# Patient Record
Sex: Male | Born: 1950
Health system: Southern US, Community
[De-identification: ages and names within clinical notes are randomized; demographics above are authoritative.]

## PROBLEM LIST (undated history)

## (undated) DIAGNOSIS — M549 Dorsalgia, unspecified: Secondary | ICD-10-CM

## (undated) DIAGNOSIS — Z720 Tobacco use: Secondary | ICD-10-CM

## (undated) DIAGNOSIS — Z860100 Personal history of colon polyps, unspecified: Secondary | ICD-10-CM

## (undated) DIAGNOSIS — F329 Major depressive disorder, single episode, unspecified: Secondary | ICD-10-CM

## (undated) DIAGNOSIS — J449 Chronic obstructive pulmonary disease, unspecified: Secondary | ICD-10-CM

## (undated) DIAGNOSIS — R7301 Impaired fasting glucose: Secondary | ICD-10-CM

## (undated) DIAGNOSIS — H353290 Exudative age-related macular degeneration, unspecified eye, stage unspecified: Secondary | ICD-10-CM

## (undated) DIAGNOSIS — I700 Atherosclerosis of aorta: Secondary | ICD-10-CM

## (undated) DIAGNOSIS — I2584 Coronary atherosclerosis due to calcified coronary lesion: Secondary | ICD-10-CM

## (undated) DIAGNOSIS — I491 Atrial premature depolarization: Secondary | ICD-10-CM

## (undated) DIAGNOSIS — M545 Low back pain, unspecified: Secondary | ICD-10-CM

## (undated) DIAGNOSIS — N400 Enlarged prostate without lower urinary tract symptoms: Secondary | ICD-10-CM

## (undated) DIAGNOSIS — F32A Depression, unspecified: Secondary | ICD-10-CM

## (undated) DIAGNOSIS — E785 Hyperlipidemia, unspecified: Secondary | ICD-10-CM

## (undated) DIAGNOSIS — I2510 Atherosclerotic heart disease of native coronary artery without angina pectoris: Secondary | ICD-10-CM

## (undated) HISTORY — DX: Low back pain, unspecified: M54.50

## (undated) HISTORY — DX: Tobacco use: Z72.0

## (undated) HISTORY — DX: Personal history of colonic polyps: Z86.010

## (undated) HISTORY — PX: BLEPHAROPLASTY: SUR158

## (undated) HISTORY — DX: Atherosclerosis of aorta: I70.0

## (undated) HISTORY — DX: Chronic obstructive pulmonary disease, unspecified: J44.9

## (undated) HISTORY — DX: Dorsalgia, unspecified: M54.9

## (undated) HISTORY — DX: Impaired fasting glucose: R73.01

## (undated) HISTORY — DX: Benign prostatic hyperplasia without lower urinary tract symptoms: N40.0

## (undated) HISTORY — DX: Exudative age-related macular degeneration, unspecified eye, stage unspecified: H35.3290

## (undated) HISTORY — PX: ELBOW SURGERY: SHX618

## (undated) HISTORY — DX: Atherosclerotic heart disease of native coronary artery without angina pectoris: I25.10

## (undated) HISTORY — DX: Personal history of colon polyps, unspecified: Z86.0100

## (undated) HISTORY — DX: Major depressive disorder, single episode, unspecified: F32.9

## (undated) HISTORY — PX: OTHER SURGICAL HISTORY: SHX169

## (undated) HISTORY — DX: Coronary atherosclerosis due to calcified coronary lesion: I25.84

## (undated) HISTORY — DX: Hyperlipidemia, unspecified: E78.5

## (undated) HISTORY — DX: Atrial premature depolarization: I49.1

## (undated) HISTORY — DX: Depression, unspecified: F32.A

---

## 2001-07-04 ENCOUNTER — Emergency Department (HOSPITAL_COMMUNITY): Admission: EM | Admit: 2001-07-04 | Discharge: 2001-07-04 | Payer: Self-pay | Admitting: Emergency Medicine

## 2001-07-04 ENCOUNTER — Encounter: Payer: Self-pay | Admitting: Emergency Medicine

## 2004-07-14 ENCOUNTER — Ambulatory Visit: Payer: Self-pay | Admitting: Internal Medicine

## 2004-08-23 ENCOUNTER — Ambulatory Visit: Payer: Self-pay | Admitting: Internal Medicine

## 2004-09-18 ENCOUNTER — Ambulatory Visit: Payer: Self-pay | Admitting: Internal Medicine

## 2004-10-23 ENCOUNTER — Ambulatory Visit: Payer: Self-pay | Admitting: Internal Medicine

## 2006-04-17 ENCOUNTER — Ambulatory Visit (HOSPITAL_BASED_OUTPATIENT_CLINIC_OR_DEPARTMENT_OTHER): Admission: RE | Admit: 2006-04-17 | Discharge: 2006-04-17 | Payer: Self-pay | Admitting: Orthopedic Surgery

## 2007-09-14 ENCOUNTER — Encounter: Admission: RE | Admit: 2007-09-14 | Discharge: 2007-09-14 | Payer: Self-pay | Admitting: Family Medicine

## 2007-10-08 ENCOUNTER — Ambulatory Visit (HOSPITAL_COMMUNITY): Admission: RE | Admit: 2007-10-08 | Discharge: 2007-10-09 | Payer: Self-pay | Admitting: Neurosurgery

## 2010-10-01 ENCOUNTER — Encounter: Payer: Self-pay | Admitting: Family Medicine

## 2011-01-23 NOTE — Op Note (Signed)
NAMEANIEL, HUBBLE NO.:  [id number]   MEDICAL RECORD NO.:  [id number]          PATIENT TYPE:  OBV   LOCATION:  3528                         FACILITY:  [facility]   PHYSICIAN:  Cristi Loron, M.D.DATE OF BIRTH:  Nov 14, 1950   DATE OF PROCEDURE:  10/08/2007  DATE OF DISCHARGE:  10/09/2007                               OPERATIVE REPORT   BRIEF HISTORY:  The patient is a 60 year old white male who has suffered  from neck and left arm pain consistent with a cervical radiculopathy.  He failed medical management, was worked up with a cervical MRI, which  demonstrated the patient had severe spondylosis and stenosis at C5-C6  and a herniated disk at C6-C7 on the left.  I discussed various  treatment options with the patient including surgery.  He has weighed  the risks, benefits and alternatives of surgery, so I will proceed with  the C5-C6 and C6-C7 anterior cervical diskectomy with fusion and  plating.   PREOPERATIVE DIAGNOSES:  C5-C6 spondylosis, C6-C7 herniated nucleus  pulposus, cervical myelopathy/radiculopathy, and cervicalgia.   POSTOPERATIVE DIAGNOSES:  :C5-C6 spondylosis, C6-C7  herniated nucleus  pulposus, cervical myelopathy/radiculopathy, and cervicalgia.   PROCEDURE:  C5-C6 and C6-C7 extensive anterior cervical  diskectomy/decompression; C5-C6 and C6-C7 anterior interbody arthrodesis  with autogenousbone graft extender and local morselized autograft bone;  insertion of C5-C6 and C6-C7 interbody prosthesis (Alphatec PEEK  interbody prosthesis); and anterior cervical plating C5 to C7 with  Codman Slim-Loc titanium plate and screws.   SURGEON:  Cristi Loron, M.D.   ASSISTANT:  Payton Doughty, M.D.   ANESTHESIA:  General endotracheal.   ESTIMATED BLOOD LOSS:  100 cc.   SPECIMENS:  None.   DRAINS:  None.   COMPLICATIONS:  None.   PROCEDURE:  The patient is brought to the operating room by the  anesthesia team.  General endotracheal anesthesia  was induced.  The  patient remained in supine position.  A roll was placed under his  shoulders to place his neck in slight extension.  His anterior cervical  region was then prepared with Betadine scrub and Betadine solution.  Sterile drapes were applied and then injected the area to be incised  with Marcaine with epinephrine solution.  A scalpel to make  a  transverse incision in the patient's left anterior neck.  I used  Metzenbaum scissors to divide the platysma muscle, then to dissect  medial to sternocleidomastoid muscle, jugular vein, and carotid artery.  I carefully dissected down towards the anterior cervical spine,  identifying the esophagus and retracting it medially.  We used  Kitner  swabs to clear soft tissue from the anterior cervical spine and then  inserted a bent spinal needle into upwards to  expose the intervertebral  disk space.  We obtained intraoperative radiograph to confirm our  location.   We then used electrocautery to detach the medial border of the longus  colli muscle bilaterally from C5-C6 and C6-C7 intervertebral space.  We  then inserted a Caspar self-retaining retractor underneath the longus  colli muscle bilaterally to provide exposure.  We began decompression  at  the worse level i.e., C5-C6.  We incised the C5-C6 intervertebral disk  with #15 blade scalpel, performed a partial intervertebral diskectomy  with the pituitary forceps.  We then inserted distraction screws to C5-  C6 distracted interspace and then used a high-speed drill to decorticate  the vertebral endplates at C5-C6 to drill away the remainder of C5-C6  intervertebral disk, to drill away some lateral posterior spondylosis  and to thin out the posterior longitudinal ligament.  We then incised  the ligament with arachnoid knife and removed it with the Kerrison punch  undercutting the vertebral endplates decompressing the thecal sac.  We  then performed foraminotomies about the bilateral C6  nerve root  completing decompression at this level.   We then repeated this procedure in analogous fashion in C6-C7  decompressing the C6-C7 thecal sac and bilateral C7 nerve roots.  Of  note, we encountered the expected soft disk herniation at C6-C7 and  compressed left C7 nerve root.   Having completed the decompression, we now turned attention to  arthrodesis.  We used trial spacers and determined use of 5- mm  mediumspacer at C6-C7 and a 6-mm medium spacer at C5-C6.  We prefilled  this prosthesis with a combination of local morselized autograft bone.  We obtained during the decompression as well as active diffuse bone  graft extender.  We inserted the prosthesis into distracted interspaces  at C5-C6 and C6-C7, we then removed the distraction screws.  There was a  good snug fit of the prosthesis at both levels.   We now turned attention to interspinal instrumentation.  We used a high-  speed drill to remove some ventral spondylosis from the vertebral  endplates at C5-C6 at C6-C7, so that the plate would lie down flat.  We  selected appropriate length Codman Slim-Loc anterior cervical plate and  laid it along the anterior aspect of the vertebrae from C5-C7.  We then  drilled two 40-mm Hoses at C5-C6 and C6-C7 and secured the plates tothe  vertebral bodies by placing two 40-mm self-tapping screws at C5-C6 and  C6-C7.  We obtained intraoperative radiograph.  We could only see the  upper plate screws and prosthesis because of the patient's shoulders,  but the lower plate screws and prosthesis with in vivo.  We therefore  secured the screws to the plate by locking each cam.   We then irrigated the wound with bacitracin solution, obtained  hemostasis using bipolar electrocautery.  We then removed the retractor.  We inspected the esophagus for any damage, there was none apparent.  We  then reapproximated the patient's platysma muscle with interrupted 3-0  Vicryl suture.  Subcutaneous   tissues with 3-0 Vicryl suture, and skin  with Steri-Strips and benzoin.  The wound was then coated with  bacitracin ointment.  A sterile dressing was applied.  The drapes were  removed and the patient was subsequently extubated by anesthesia team  and transported to post anesthesia care unit in stable condition.  All  sponge, instrument, and needle counts correct at this case.      Cristi Loron, M.D.  Electronically Signed     JDJ/MEDQ  D:  10/08/2007  T:  10/09/2007  Job:  [id number]

## 2011-01-26 NOTE — Op Note (Signed)
NAMEAMAURIS, [name]                ACCOUNT NO.:  [id number]   MEDICAL RECORD NO.:  [id number]          PATIENT TYPE:  AMB   LOCATION:  DSC                          FACILITY:  [facility]   PHYSICIAN:  Cindee Salt, M.D.       DATE OF BIRTH:  1951/06/02   DATE OF PROCEDURE:  04/17/2006  DATE OF DISCHARGE:                                 OPERATIVE REPORT   PREOPERATIVE DIAGNOSIS:  Loose body, right elbow.   POSTOPERATIVE DIAGNOSIS:  Loose body, right elbow.   OPERATION:  Excisoin loose body, right elbow, arthroscopically.   SURGEON:  Cindee Salt, M.D.   ASSISTANT:  Artist Pais. Mina Marble, M.D.   ANESTHESIA:  General.   HISTORY:  The patient is a 60 year old male with a history of mechanical  symptoms of his right elbow.  X-ray and MRIs revealed a loose body in the  anterior aspect of the joint.  He has no prior history of discreet injury.  He is desirous of removal.  He is aware of risks and complications including  injury to the arteries, tendons, and nerves with continuation of loose body  formation, possibility of infection, incomplete relief of symptoms,  dystrophy.   PROCEDURE:  The patient was seen in the preoperative area.  Questions  encouraged and answered, the extremity marked.  The patient was taken to the  operating room where a general anesthetic was carried without difficulty.  He was placed in lateral decubitus position with bean bag support of his  right arm.  Prepped using Duraprep in the lateral decubitus position with  the right arm free.  The joint was inflated through the soft spot after  marking each one of the landmarks.  A medial portal was the opened,  approximately 2 cm proximal to the medial epicondyle.  An incision was made  through skin only.  A hemostat was used to identify the medial intramuscular  septum.  A blunt trocar was used to enter the joint after inflation with 20  cc of saline.  The loose body was immediately apparent directly anterior to  the  radial head.  The capitellum showed no changes.  There were no changes  on the medial side of the joint.  A 22-gauge spinal needle was then used to  localize the radial portal.  This was done approximately 1.5 cm proximal to  the lateral epicondyle and directly anterior to the lateral epicondyle.  The  needle localized the loose body and incision made.  Then, a blunt trocar was  used to enter the joint.  A grasper was then introduced and the loose body  was easily retrieved.  This measured approximately 1.5 cm in diameter.  No  further loose bodies were noted in the anterior joint.  The posterior joint  was then inspected through the soft spot.  The incision made.  A hemostat  used to enter the joint.  The joint was inspected posteriorly.  There were  arthritic changes on the olecranon and on the radial head but no further  loose bodies were noted posteriorly.  An irrigation catheter was placed  in  the posterior central portion proximal to the olecranon tip.  The lateral  gutter was then fully inspected.  The posterior fossa was inspected and no  further lesions were noted.  No loose bodies were present.  The joint was  then irrigated and 40 mg Kenalog, along with 2 cc of Marcaine were then  injected through the posterior portal.  The instruments  were removed.  The portals closed with interrupted 5-0 nylon sutures and a  sterile compressive dressing applied.  The patient tolerated the procedure  well and was taken to the recovery room for observation in satisfactory  condition.  He will be discharged home to return to the [facility] of  [place] in one week on Vicodin.           ______________________________  Cindee Salt, M.D.     GK/MEDQ  D:  04/17/2006  T:  04/17/2006  Job:  [id number]

## 2011-05-31 LAB — CBC
HCT: 43.2
Hemoglobin: 15.3
MCHC: 35.3
MCV: 90.1
Platelets: 263
RBC: 4.8
RDW: 13.5
WBC: 10.3

## 2013-07-23 ENCOUNTER — Other Ambulatory Visit: Payer: Self-pay | Admitting: Family Medicine

## 2013-07-23 DIAGNOSIS — F17200 Nicotine dependence, unspecified, uncomplicated: Secondary | ICD-10-CM

## 2013-07-31 ENCOUNTER — Ambulatory Visit
Admission: RE | Admit: 2013-07-31 | Discharge: 2013-07-31 | Disposition: A | Discharge status: Home / Self Care | Payer: No Typology Code available for payment source | Source: Ambulatory Visit | Attending: Family Medicine | Admitting: Family Medicine

## 2013-07-31 DIAGNOSIS — F17200 Nicotine dependence, unspecified, uncomplicated: Secondary | ICD-10-CM

## 2013-08-20 ENCOUNTER — Encounter: Payer: Self-pay | Admitting: Cardiology

## 2013-08-20 ENCOUNTER — Encounter: Payer: Self-pay | Admitting: *Deleted

## 2013-08-20 DIAGNOSIS — I491 Atrial premature depolarization: Secondary | ICD-10-CM | POA: Insufficient documentation

## 2013-08-20 DIAGNOSIS — M549 Dorsalgia, unspecified: Secondary | ICD-10-CM | POA: Insufficient documentation

## 2013-08-20 DIAGNOSIS — E785 Hyperlipidemia, unspecified: Secondary | ICD-10-CM | POA: Insufficient documentation

## 2013-08-20 DIAGNOSIS — F329 Major depressive disorder, single episode, unspecified: Secondary | ICD-10-CM | POA: Insufficient documentation

## 2013-08-21 ENCOUNTER — Encounter (INDEPENDENT_AMBULATORY_CARE_PROVIDER_SITE_OTHER): Payer: Self-pay

## 2013-08-21 ENCOUNTER — Encounter: Payer: Self-pay | Admitting: Cardiology

## 2013-08-21 ENCOUNTER — Ambulatory Visit (INDEPENDENT_AMBULATORY_CARE_PROVIDER_SITE_OTHER): Payer: 59 | Admitting: Cardiology

## 2013-08-21 VITALS — BP 120/64 | HR 51 | Ht 66.0 in | Wt 158.0 lb

## 2013-08-21 DIAGNOSIS — F17200 Nicotine dependence, unspecified, uncomplicated: Secondary | ICD-10-CM

## 2013-08-21 DIAGNOSIS — I2584 Coronary atherosclerosis due to calcified coronary lesion: Secondary | ICD-10-CM

## 2013-08-21 DIAGNOSIS — I2510 Atherosclerotic heart disease of native coronary artery without angina pectoris: Secondary | ICD-10-CM

## 2013-08-21 DIAGNOSIS — I700 Atherosclerosis of aorta: Secondary | ICD-10-CM | POA: Insufficient documentation

## 2013-08-21 DIAGNOSIS — Z720 Tobacco use: Secondary | ICD-10-CM

## 2013-08-21 DIAGNOSIS — I491 Atrial premature depolarization: Secondary | ICD-10-CM

## 2013-08-21 HISTORY — DX: Atherosclerotic heart disease of native coronary artery without angina pectoris: I25.10

## 2013-08-21 HISTORY — DX: Atherosclerosis of aorta: I70.0

## 2013-08-21 NOTE — Progress Notes (Signed)
[address] [place], [place]  [address] Phone: [phone] Fax:  [phone]  Date:  08/21/2013   ID:  VINOD MIKESELL, DOB [birth year], MRN [id number]  PCP:  Mickie Hillier, MD   History of Present Illness: [name] is a 62 y.o. male who was previously seen last year with palpitations. PACs were seen on EKG. He used to drink approximately 4 cups of caffeine in the morning. TSH was normal at that time. His exercise treadmill test performed at that time was low risk. His LDL cholesterol in 7/09 was 188. He continues to smokes packs a day. He's had multiple statin intolerances. Pravastatin caused GI upset. He has seen a specialist at [facility].  Feeling well. Working out daily. Very vigorous workout routine. No exertional anginal symptoms.  Came today because he has had a calcium score of approximately 100, his brother is 10. Mild calcium was seen in the LAD as well as RCA as well as thoracic aorta. We discussed this at length. Please see below.  Is currently using electronic cigarettes. Struggled with quitting.    Wt Readings from Last 3 Encounters:  08/21/13 158 lb (71.668 kg)     Past Medical History  Diagnosis Date  . Depression   . Dyslipidemia   . Hyperlipidemia   . Back pain   . PAC (premature atrial contraction)     Past Surgical History  Procedure Laterality Date  . C5-c6 and c6-c7 anterior cervical discectomy      with fusion and plating in 2009 with neurosurgeon Dr. Lovell Sheehan  . Elbow surgery      Current Outpatient Prescriptions  Medication Sig Dispense Refill  . ezetimibe (ZETIA) 10 MG tablet Take 10 mg by mouth daily.      . finasteride (PROSCAR) 5 MG tablet Take 5 mg by mouth daily.       No current facility-administered medications for this visit.    Allergies:    Allergies  Allergen Reactions  . Statins     CAUSES EXTREME  MUSCLE AND JOINT PAIN    Social History:  The patient  reports that he has been  smoking.  He does not have any smokeless tobacco history on file. He reports that he drinks alcohol. He reports that he does not use illicit drugs.   ROS:  Please see the history of present illness.   Denies any syncope, bleeding, orthopnea, PND    PHYSICAL EXAM: VS:  BP 120/64  Pulse 51  Ht 5\' 6"  (1.676 m)  Wt 158 lb (71.668 kg)  BMI 25.51 kg/m2  SpO2 98% Well nourished, well developed, in no acute distress HEENT: normal Neck: no JVD Cardiac:  normal S1, S2; RRR; no murmur Lungs:  clear to auscultation bilaterally, no wheezing, rhonchi or rales Abd: soft, nontender, no hepatomegaly Ext: no edema Skin: warm and dry Neuro: no focal abnormalities noted  EKG:  Sinus bradycardia rate 51 with premature atrial contraction.    Screening CT scan-LAD/RCA/aortic atherosclerosis noted. Personally viewed with him.  ASSESSMENT AND PLAN:  1. Coronary artery calcification-calcium score was approximately 100. This was not greater than 400. Given his lack of symptoms, prior exercise treadmill test, I do not think that he requires further cardiac testing at this time however I would continue to advocate secondary prevention efforts most notably tobacco cessation. I expressed the importance of this to him. Out of all of his risk factors, smoking obviously is the greatest and will  likely contribute to any future cardiovascular events. He knows that if she begins to feel any worsening shortness of breath, chest pain to let me know and further stress testing can take place. 2. Tobacco cessation discussed at length. 3. Aortic atherosclerosis-multiple statin intolerances. Continue with Zetia. 4. Premature atrial contractions-benign. Seen on EKG. Echocardiogram in 2010 with normal EF.  Signed, Donato Schultz, MD [facility]  08/21/2013 3:30 PM

## 2013-08-21 NOTE — Patient Instructions (Signed)
Your physician recommends that you continue on your current medications as directed. Please refer to the Current Medication list given to you today.  Your physician wants you to follow-up as needed. 

## 2014-07-15 ENCOUNTER — Telehealth: Payer: Self-pay

## 2014-07-15 NOTE — Telephone Encounter (Signed)
Spoke with patient regarding post ioeraitve status, he states that he is doing well and is keeping his foot elevated and taking pain medications as scheduled, patient is to be seen in office tomorrow

## 2014-07-16 NOTE — Telephone Encounter (Signed)
error 

## 2015-02-04 ENCOUNTER — Other Ambulatory Visit: Payer: Self-pay | Admitting: Family Medicine

## 2015-02-04 DIAGNOSIS — F17200 Nicotine dependence, unspecified, uncomplicated: Secondary | ICD-10-CM

## 2015-02-15 ENCOUNTER — Ambulatory Visit
Admission: RE | Admit: 2015-02-15 | Discharge: 2015-02-15 | Disposition: A | Discharge status: Home / Self Care | Payer: No Typology Code available for payment source | Source: Ambulatory Visit | Attending: Family Medicine | Admitting: Family Medicine

## 2015-02-15 DIAGNOSIS — F17200 Nicotine dependence, unspecified, uncomplicated: Secondary | ICD-10-CM

## 2015-03-01 ENCOUNTER — Encounter: Payer: Self-pay | Admitting: Acute Care

## 2015-11-29 ENCOUNTER — Other Ambulatory Visit: Payer: Self-pay | Admitting: Acute Care

## 2015-11-29 DIAGNOSIS — F17210 Nicotine dependence, cigarettes, uncomplicated: Principal | ICD-10-CM

## 2016-02-20 ENCOUNTER — Ambulatory Visit
Admission: RE | Admit: 2016-02-20 | Discharge: 2016-02-20 | Disposition: A | Discharge status: Home / Self Care | Payer: 59 | Source: Ambulatory Visit | Attending: Acute Care | Admitting: Acute Care

## 2016-02-20 DIAGNOSIS — F17210 Nicotine dependence, cigarettes, uncomplicated: Principal | ICD-10-CM

## 2016-02-22 ENCOUNTER — Telehealth: Payer: Self-pay | Admitting: Acute Care

## 2016-02-22 DIAGNOSIS — F17210 Nicotine dependence, cigarettes, uncomplicated: Principal | ICD-10-CM

## 2016-02-22 NOTE — Telephone Encounter (Signed)
Message left that scan was normal on cell phone voice mail.Requested that patient return call if he wanted more detailed information. I will forward results to the patient's PCP for his medical record..Marland Kitchen

## 2016-11-09 DIAGNOSIS — J0100 Acute maxillary sinusitis, unspecified: Secondary | ICD-10-CM | POA: Diagnosis not present

## 2017-02-05 DIAGNOSIS — R001 Bradycardia, unspecified: Secondary | ICD-10-CM | POA: Diagnosis not present

## 2017-02-05 DIAGNOSIS — J209 Acute bronchitis, unspecified: Secondary | ICD-10-CM | POA: Diagnosis not present

## 2017-02-15 DIAGNOSIS — Z0000 Encounter for general adult medical examination without abnormal findings: Secondary | ICD-10-CM | POA: Diagnosis not present

## 2017-02-15 DIAGNOSIS — E782 Mixed hyperlipidemia: Secondary | ICD-10-CM | POA: Diagnosis not present

## 2017-02-22 ENCOUNTER — Ambulatory Visit
Admission: RE | Admit: 2017-02-22 | Discharge: 2017-02-22 | Disposition: A | Discharge status: Home / Self Care | Payer: 59 | Source: Ambulatory Visit | Attending: Acute Care | Admitting: Acute Care

## 2017-02-22 DIAGNOSIS — Z87891 Personal history of nicotine dependence: Secondary | ICD-10-CM | POA: Diagnosis not present

## 2017-02-22 DIAGNOSIS — F17210 Nicotine dependence, cigarettes, uncomplicated: Principal | ICD-10-CM

## 2017-02-25 ENCOUNTER — Other Ambulatory Visit: Payer: Self-pay | Admitting: Acute Care

## 2017-02-25 DIAGNOSIS — Z87891 Personal history of nicotine dependence: Secondary | ICD-10-CM

## 2017-02-27 ENCOUNTER — Telehealth: Payer: Self-pay

## 2017-02-27 NOTE — Telephone Encounter (Signed)
SENT NOTES TO SCHEDULING 

## 2017-05-06 ENCOUNTER — Encounter: Payer: Self-pay | Admitting: Internal Medicine

## 2017-05-06 ENCOUNTER — Ambulatory Visit (INDEPENDENT_AMBULATORY_CARE_PROVIDER_SITE_OTHER): Payer: 59 | Admitting: Internal Medicine

## 2017-05-06 VITALS — BP 130/74 | HR 48 | Ht 66.0 in | Wt 169.5 lb

## 2017-05-06 DIAGNOSIS — I2510 Atherosclerotic heart disease of native coronary artery without angina pectoris: Secondary | ICD-10-CM

## 2017-05-06 DIAGNOSIS — E782 Mixed hyperlipidemia: Secondary | ICD-10-CM

## 2017-05-06 NOTE — Patient Instructions (Signed)
Your physician recommends that you continue on your current medications as directed. Please refer to the Current Medication list given to you today. SCHEDULE A CALCIUM SCORE CT SCAN AT CHECKOUT TODAY.  THE OUT OF POCKET COST IS $150 FOR THIS EXAM.  Your physician recommends that you schedule a follow-up appointment WITH OUR PHARMACIST TO DISCUSS STARTING REPATHA.

## 2017-05-06 NOTE — Progress Notes (Signed)
Cardiology Office Note   Date:  05/06/2017   ID:  [name] 05-May-1951, MRN [id number]  PCP:  Catha Gosselin, MD  Cardiologist:   Dietrich Pates, MD   Pt referredd by Dr Clarene Duke for lipid eval and CAD eval    History of Present Illness: [name] is a 66 y.o. male with a history of HL, coronary calcifications, tob use    Intoleranct to statins    Previously followed by Michele Rockers  Seen in Dec 2014 for palpitations , PACs    LDL in past 188   Pt says Zetia didn't help much  Denies CP  Active  Runs [employer]  Works out regularly No SOB   Pt quit tobacco about 3 months ago  Breathing is much better    Current Meds  Medication Sig  . finasteride (PROSCAR) 5 MG tablet Take 5 mg by mouth daily.  . tamsulosin (FLOMAX) 0.4 MG CAPS capsule Take 0.4 mg by mouth.     Allergies:   Statins   Past Medical History:  Diagnosis Date  . Aortic atherosclerosis (HCC) 08/21/2013   Seen on CT scan  . Back pain   . Coronary artery calcification 08/21/2013   LAD/RCA-calcium score 160 in 2014  . Depression   . Dyslipidemia   . Hyperlipidemia   . PAC (premature atrial contraction)     Past Surgical History:  Procedure Laterality Date  . C5-C6 and C6-C7 anterior cervical discectomy     with fusion and plating in 2009 with neurosurgeon Dr. Lovell Sheehan  . ELBOW SURGERY       Social History:  The patient  reports that he has quit smoking. His smoking use included Cigarettes. He has a 32.00 pack-year smoking history. He does not have any smokeless tobacco history on file. He reports that he drinks alcohol. He reports that he does not use drugs.   Family History:  The patient's family history includes Heart attack in his father.    ROS:  Please see the history of present illness. All other systems are reviewed and  Negative to the above problem except as noted.    PHYSICAL EXAM: VS:  BP 130/74   Pulse (!) 48   Ht 5\' 6"  (1.676 m)   Wt 169 lb 8 oz (76.9 kg)   SpO2  97%   BMI 27.36 kg/m   GEN: Well nourished, well developed, in no acute distress  HEENT: normal  Neck: no JVD, carotid bruits, or masses Cardiac: RRR; no murmurs, rubs, or gallops,no edema  Respiratory:  clear to auscultation bilaterally, normal work of breathing GI: soft, nontender, nondistended, + BS  No hepatomegaly  MS: no deformity Moving all extremities   Skin: warm and dry, no rash Neuro:  Strength and sensation are intact Psych: euthymic mood, full affect   EKG:  EKG is ordered today.  SB 48 bpm  Septal MI     Lipid Panel No results found for: CHOL, TRIG, HDL, CHOLHDL, VLDL, LDLCALC, LDLDIRECT    Wt Readings from Last 3 Encounters:  05/06/17 169 lb 8 oz (76.9 kg)  08/21/13 158 lb (71.7 kg)      ASSESSMENT AND PLAN:  1  CAD  CA score as 162 4 years ago  He is interested in seeing if progressed  Will set up for CT score  2  HL  He needs tighter control of lipidsLDL was 132 in June HDL was 23   Did not tolerate statins  Multiple due to myalgias  Zetia didn't do much   Recomm Repatha  F/U lipids in 2 months    Tentative f/u in clinic in 1 year      Current medicines are reviewed at length with the patient today.  The patient does not have concerns regarding medicines.  Signed, Dietrich Pates, MD  05/06/2017 10:21 AM    [facility] [address] [place], [place], [place]  [address] Phone: [phone]; Fax: [phone]

## 2017-05-17 ENCOUNTER — Ambulatory Visit (INDEPENDENT_AMBULATORY_CARE_PROVIDER_SITE_OTHER)
Admission: RE | Admit: 2017-05-17 | Discharge: 2017-05-17 | Disposition: A | Discharge status: Home / Self Care | Payer: Self-pay | Source: Ambulatory Visit | Attending: Internal Medicine | Admitting: Internal Medicine

## 2017-05-17 DIAGNOSIS — I2510 Atherosclerotic heart disease of native coronary artery without angina pectoris: Secondary | ICD-10-CM

## 2017-05-22 ENCOUNTER — Encounter: Payer: Self-pay | Admitting: Internal Medicine

## 2017-05-24 DIAGNOSIS — H33193 Other retinoschisis and retinal cysts, bilateral: Secondary | ICD-10-CM | POA: Diagnosis not present

## 2017-05-24 DIAGNOSIS — H35721 Serous detachment of retinal pigment epithelium, right eye: Secondary | ICD-10-CM | POA: Diagnosis not present

## 2017-05-30 NOTE — Progress Notes (Signed)
Patient ID: [name]                 DOB: Dec 06, 1950                    MRN: [id number]     HPI: [name] is a 66 y.o. male patient referred to lipid clinic by Dr Tenny Craw. PMH is significant for HLD, aortic atherosclerosis seen on CT scan, tobacco use, and statin intolerance. Pt had an elevated calcium score of 160 in 2014. This was repeated in 05/2017 and revealed a calcium score of 138 which was 66th percentile when matched for age and sex. Moderate calcium seen in the proximal and mid LAD and RCA. Pt referred to lipid clinic to discuss PCSK9i therapy.  Pt present today in good spirits. He reports trying multiple statins over the years but has not tried any within the last 7-8 years. He reports crippling leg pain that affected his ability to walk up the stairs. His symptoms would start within 1 week of statin initiation and stopped upon drug discontinuation. He recalls trying Lipitor, Crestor, and pravastatin. He cannot recall doses for any of these. He also tried Zetia however reports that this was ineffective at lowering his cholesterol.  Current Medications: none Intolerances: pravastatin - GI upset, Lipitor, Crestor, Zetia - ineffective Risk Factors: aortic atherosclerosis, elevated calcium score, smoking, family history LDL goal: 70mg /dL  Diet: Works out of town and frequently eats at [employer]. Does eat a lot of lean meat ([place] and chicken). Does not eat much dairy. Does eat 2-3 eggs per day.   Exercise: Stays active - goes to the gym 5 days a week. Alternates cardio and weights.   Family History: The patient's family history includes Heart attack in his father.   Social History: The patient  reports that he has quit smoking. His smoking use included Cigarettes. He has a 32.00 pack-year smoking history. He does not have any smokeless tobacco history on file. He reports that he drinks alcohol. He reports that he does not use drugs.   Labs: 02/15/17: TC 213, TG 121, HDL 56,  LDL 132 (no therapy) 02/09/16: TC 204, TG 152, HDL 48, LDL 125 02/02/15: TC 199, TG 96, HDL 48, LDL 132  Past Medical History:  Diagnosis Date  . Aortic atherosclerosis (HCC) 08/21/2013   Seen on CT scan  . Back pain   . Coronary artery calcification 08/21/2013   LAD/RCA-calcium score 160 in 2014  . Depression   . Dyslipidemia   . Hyperlipidemia   . PAC (premature atrial contraction)     Current Outpatient Prescriptions on File Prior to Visit  Medication Sig Dispense Refill  . finasteride (PROSCAR) 5 MG tablet Take 5 mg by mouth daily.    . tamsulosin (FLOMAX) 0.4 MG CAPS capsule Take 0.4 mg by mouth.     No current facility-administered medications on file prior to visit.     Allergies  Allergen Reactions  . Statins     CAUSES EXTREME  MUSCLE AND JOINT PAIN    Assessment/Plan:  1. Hyperlipidemia - LDL 132 at baseline above goal <70 due to history of aortic atherosclerosis and elevated calcium score. Pt is intolerant to multiple statins and Zetia was ineffective at lowering his cholesterol. Discussed PCSK9i therapy, however pt does not want to take bimonthly injections since he is frequently on the road for work. I also checked with his [facility] insurance and they do not currently cover PCSK9i therapy for  patients with CAD without revascularization. Pt would prefer to rechallenge with statin therapy. Will start Crestor  daily. Advised pt that if he experiences myalgias he can reduce frequency to 3x per week. Will call pt in 1 month to assess tolerability to Crestor and can adjust statin therapy at that time if needed. Can try Livalo in the future if pt does not end up tolerating Crestor.   Megan E. Supple, PharmD, CPP, BCACP [facility] [address] [place], [place] [address] Phone: [phone]; Fax: [phone] 05/31/2017 9:19 AM

## 2017-05-31 ENCOUNTER — Ambulatory Visit (INDEPENDENT_AMBULATORY_CARE_PROVIDER_SITE_OTHER): Payer: 59 | Admitting: Pharmacist

## 2017-05-31 DIAGNOSIS — E785 Hyperlipidemia, unspecified: Secondary | ICD-10-CM | POA: Diagnosis not present

## 2017-05-31 MED ORDER — ROSUVASTATIN CALCIUM 5 MG PO TABS: 5.0000 mg | ORAL_TABLET | Freq: Every day | ORAL | 11 refills | Status: DC

## 2017-05-31 NOTE — Patient Instructions (Signed)
It was nice to see you today!  Start taking rosuvastatin (Crestor)  daily for your cholesterol. If you develop muscle aches, try cutting your dose back to 3x per week.  Try to cut back on your egg yolk intake by 50%. Try to supplement with egg whites instead.  Call Megan in the lipid clinic in about a month and let me know how you're doing with the Crestor [phone].

## 2017-06-17 DIAGNOSIS — H353122 Nonexudative age-related macular degeneration, left eye, intermediate dry stage: Secondary | ICD-10-CM | POA: Diagnosis not present

## 2017-06-17 DIAGNOSIS — H353211 Exudative age-related macular degeneration, right eye, with active choroidal neovascularization: Secondary | ICD-10-CM | POA: Diagnosis not present

## 2017-06-17 DIAGNOSIS — H35423 Microcystoid degeneration of retina, bilateral: Secondary | ICD-10-CM | POA: Diagnosis not present

## 2017-06-26 ENCOUNTER — Telehealth: Payer: Self-pay

## 2017-06-26 NOTE — Telephone Encounter (Signed)
Received message from refill dept: pharmacy was asking for possible cheaper alternative.  Reviewed with [facility].   Pt paying about $30 per month.   The pharmacy wanted to offer that maybe he could try something lower cost.   She said the patient said he would pay if that's what we want him on.    I explained its noted he is intolerant to several statins and if he's tolerating and can afford we should not consider changing at this point

## 2017-07-01 ENCOUNTER — Telehealth: Payer: Self-pay | Admitting: Pharmacist

## 2017-07-01 DIAGNOSIS — H353211 Exudative age-related macular degeneration, right eye, with active choroidal neovascularization: Secondary | ICD-10-CM | POA: Diagnosis not present

## 2017-07-01 DIAGNOSIS — E782 Mixed hyperlipidemia: Secondary | ICD-10-CM

## 2017-07-01 NOTE — Telephone Encounter (Signed)
Called pt to assess Crestor tolerability during the past month. Pt is very pleased that he has been tolerating Crestor 5mg  daily with no adverse effects. He is agreeable to continuing therapy. Scheduled f/u lab work in another 6 weeks to assess efficacy.

## 2017-07-29 DIAGNOSIS — H353122 Nonexudative age-related macular degeneration, left eye, intermediate dry stage: Secondary | ICD-10-CM | POA: Diagnosis not present

## 2017-07-29 DIAGNOSIS — H43813 Vitreous degeneration, bilateral: Secondary | ICD-10-CM | POA: Diagnosis not present

## 2017-07-29 DIAGNOSIS — H35423 Microcystoid degeneration of retina, bilateral: Secondary | ICD-10-CM | POA: Diagnosis not present

## 2017-08-08 ENCOUNTER — Encounter: Payer: Self-pay | Admitting: Internal Medicine

## 2017-08-12 ENCOUNTER — Other Ambulatory Visit: Payer: 59 | Admitting: *Deleted

## 2017-08-12 DIAGNOSIS — E782 Mixed hyperlipidemia: Secondary | ICD-10-CM

## 2017-08-12 LAB — LIPID PANEL
Chol/HDL Ratio: 2.7 ratio (ref 0.0–5.0)
Cholesterol, Total: 186 mg/dL (ref 100–199)
HDL: 69 mg/dL (ref >39)
LDL Calculated: 108 mg/dL — ABNORMAL HIGH (ref 0–99)
TRIGLYCERIDES: 43 mg/dL (ref 0–149)
VLDL Cholesterol Cal: 9 mg/dL (ref 5–40)

## 2017-08-12 LAB — HEPATIC FUNCTION PANEL
ALT: 12 IU/L (ref 0–44)
AST: 19 IU/L (ref 0–40)
Albumin: 4.4 g/dL (ref 3.6–4.8)
Alkaline Phosphatase: 51 IU/L (ref 39–117)
Bilirubin Total: 0.3 mg/dL (ref 0.0–1.2)
Bilirubin, Direct: 0.09 mg/dL (ref 0.00–0.40)
TOTAL PROTEIN: 6.1 g/dL (ref 6.0–8.5)

## 2017-08-13 ENCOUNTER — Telehealth: Payer: Self-pay | Admitting: Pharmacist

## 2017-08-13 DIAGNOSIS — E782 Mixed hyperlipidemia: Secondary | ICD-10-CM

## 2017-08-13 MED ORDER — ROSUVASTATIN CALCIUM 10 MG PO TABS: 10.0000 mg | ORAL_TABLET | Freq: Every day | ORAL | 11 refills | Status: DC

## 2017-08-13 NOTE — Telephone Encounter (Signed)
Pt has been tolerating Crestor 5mg  daily and LDL has improved from baseline of 132 to 108, however remains above aggressive goal < 70 due to hx of elevated calcium score and aortic atherosclerosis seen on CT scan. Pt is willing to increase Crestor to 10mg  daily. Will recheck lipids in 3 months.

## 2017-08-26 DIAGNOSIS — H353122 Nonexudative age-related macular degeneration, left eye, intermediate dry stage: Secondary | ICD-10-CM | POA: Diagnosis not present

## 2017-08-26 DIAGNOSIS — Z23 Encounter for immunization: Secondary | ICD-10-CM | POA: Diagnosis not present

## 2017-08-26 DIAGNOSIS — H35423 Microcystoid degeneration of retina, bilateral: Secondary | ICD-10-CM | POA: Diagnosis not present

## 2017-08-26 DIAGNOSIS — H43813 Vitreous degeneration, bilateral: Secondary | ICD-10-CM | POA: Diagnosis not present

## 2017-08-31 DIAGNOSIS — H66002 Acute suppurative otitis media without spontaneous rupture of ear drum, left ear: Secondary | ICD-10-CM | POA: Diagnosis not present

## 2017-09-02 DIAGNOSIS — H9202 Otalgia, left ear: Secondary | ICD-10-CM | POA: Diagnosis not present

## 2017-09-02 DIAGNOSIS — H6092 Unspecified otitis externa, left ear: Secondary | ICD-10-CM | POA: Diagnosis not present

## 2017-09-02 DIAGNOSIS — H6692 Otitis media, unspecified, left ear: Secondary | ICD-10-CM | POA: Diagnosis not present

## 2017-09-06 DIAGNOSIS — R21 Rash and other nonspecific skin eruption: Secondary | ICD-10-CM | POA: Diagnosis not present

## 2017-09-06 DIAGNOSIS — H6022 Malignant otitis externa, left ear: Secondary | ICD-10-CM | POA: Diagnosis not present

## 2017-09-06 DIAGNOSIS — H9202 Otalgia, left ear: Secondary | ICD-10-CM | POA: Diagnosis not present

## 2017-09-13 DIAGNOSIS — H60502 Unspecified acute noninfective otitis externa, left ear: Secondary | ICD-10-CM | POA: Diagnosis not present

## 2017-09-13 DIAGNOSIS — H6122 Impacted cerumen, left ear: Secondary | ICD-10-CM | POA: Diagnosis not present

## 2017-09-27 DIAGNOSIS — H353211 Exudative age-related macular degeneration, right eye, with active choroidal neovascularization: Secondary | ICD-10-CM | POA: Diagnosis not present

## 2017-09-27 DIAGNOSIS — H353122 Nonexudative age-related macular degeneration, left eye, intermediate dry stage: Secondary | ICD-10-CM | POA: Diagnosis not present

## 2017-10-04 DIAGNOSIS — J310 Chronic rhinitis: Secondary | ICD-10-CM | POA: Diagnosis not present

## 2017-10-04 DIAGNOSIS — H6022 Malignant otitis externa, left ear: Secondary | ICD-10-CM | POA: Diagnosis not present

## 2017-10-04 DIAGNOSIS — J342 Deviated nasal septum: Secondary | ICD-10-CM | POA: Diagnosis not present

## 2017-11-11 DIAGNOSIS — H43813 Vitreous degeneration, bilateral: Secondary | ICD-10-CM | POA: Diagnosis not present

## 2017-11-11 DIAGNOSIS — H353122 Nonexudative age-related macular degeneration, left eye, intermediate dry stage: Secondary | ICD-10-CM | POA: Diagnosis not present

## 2017-11-11 DIAGNOSIS — H35423 Microcystoid degeneration of retina, bilateral: Secondary | ICD-10-CM | POA: Diagnosis not present

## 2017-11-15 ENCOUNTER — Other Ambulatory Visit: Payer: 59

## 2017-11-22 ENCOUNTER — Other Ambulatory Visit: Payer: 59

## 2017-11-22 DIAGNOSIS — E782 Mixed hyperlipidemia: Secondary | ICD-10-CM | POA: Diagnosis not present

## 2017-11-22 LAB — LIPID PANEL
CHOL/HDL RATIO: 2.3 ratio (ref 0.0–5.0)
Cholesterol, Total: 163 mg/dL (ref 100–199)
HDL: 71 mg/dL (ref >39)
LDL CALC: 75 mg/dL (ref 0–99)
TRIGLYCERIDES: 87 mg/dL (ref 0–149)
VLDL Cholesterol Cal: 17 mg/dL (ref 5–40)

## 2017-11-26 ENCOUNTER — Other Ambulatory Visit: Payer: Self-pay | Admitting: *Deleted

## 2017-11-26 DIAGNOSIS — E785 Hyperlipidemia, unspecified: Secondary | ICD-10-CM

## 2017-11-26 MED ORDER — ROSUVASTATIN CALCIUM 20 MG PO TABS: 20.0000 mg | ORAL_TABLET | Freq: Every day | ORAL | 11 refills | Status: DC

## 2017-12-09 DIAGNOSIS — H35423 Microcystoid degeneration of retina, bilateral: Secondary | ICD-10-CM | POA: Diagnosis not present

## 2017-12-09 DIAGNOSIS — H353122 Nonexudative age-related macular degeneration, left eye, intermediate dry stage: Secondary | ICD-10-CM | POA: Diagnosis not present

## 2017-12-09 DIAGNOSIS — H33192 Other retinoschisis and retinal cysts, left eye: Secondary | ICD-10-CM | POA: Diagnosis not present

## 2017-12-09 DIAGNOSIS — H353211 Exudative age-related macular degeneration, right eye, with active choroidal neovascularization: Secondary | ICD-10-CM | POA: Diagnosis not present

## 2018-01-02 ENCOUNTER — Encounter: Payer: Self-pay | Admitting: Internal Medicine

## 2018-01-06 DIAGNOSIS — H35423 Microcystoid degeneration of retina, bilateral: Secondary | ICD-10-CM | POA: Diagnosis not present

## 2018-01-06 DIAGNOSIS — H353122 Nonexudative age-related macular degeneration, left eye, intermediate dry stage: Secondary | ICD-10-CM | POA: Diagnosis not present

## 2018-01-06 DIAGNOSIS — H33192 Other retinoschisis and retinal cysts, left eye: Secondary | ICD-10-CM | POA: Diagnosis not present

## 2018-01-07 ENCOUNTER — Telehealth: Payer: Self-pay | Admitting: Internal Medicine

## 2018-01-07 MED ORDER — ROSUVASTATIN CALCIUM 10 MG PO TABS: 10.0000 mg | ORAL_TABLET | Freq: Every day | ORAL | 3 refills | Status: DC

## 2018-01-07 NOTE — Addendum Note (Signed)
Addended by: Sigurd Sos on: 01/07/2018 02:09 PM   Modules accepted: Orders

## 2018-01-07 NOTE — Telephone Encounter (Signed)
Spoke with patient and gave him recommendation (resume Crestor  and re-evaluate muscles and joints). He verbalized understanding.

## 2018-01-07 NOTE — Telephone Encounter (Signed)
Pt has emailed in [employer]  Reviewed with pharmacy Would recomm he go back on Crestor 10 Follow joints/muscles

## 2018-01-07 NOTE — Telephone Encounter (Signed)
LPMTCB with Dr Tenny Craw' suggestion.  4/30

## 2018-02-10 DIAGNOSIS — H353122 Nonexudative age-related macular degeneration, left eye, intermediate dry stage: Secondary | ICD-10-CM | POA: Diagnosis not present

## 2018-02-10 DIAGNOSIS — H33102 Unspecified retinoschisis, left eye: Secondary | ICD-10-CM | POA: Diagnosis not present

## 2018-02-10 DIAGNOSIS — H43813 Vitreous degeneration, bilateral: Secondary | ICD-10-CM | POA: Diagnosis not present

## 2018-02-10 DIAGNOSIS — H353211 Exudative age-related macular degeneration, right eye, with active choroidal neovascularization: Secondary | ICD-10-CM | POA: Diagnosis not present

## 2018-02-21 ENCOUNTER — Other Ambulatory Visit: Payer: 59

## 2018-02-24 DIAGNOSIS — Z0000 Encounter for general adult medical examination without abnormal findings: Secondary | ICD-10-CM | POA: Diagnosis not present

## 2018-02-26 DIAGNOSIS — Z0000 Encounter for general adult medical examination without abnormal findings: Secondary | ICD-10-CM | POA: Diagnosis not present

## 2018-02-26 DIAGNOSIS — Z86010 Personal history of colonic polyps: Secondary | ICD-10-CM | POA: Diagnosis not present

## 2018-02-26 DIAGNOSIS — I2510 Atherosclerotic heart disease of native coronary artery without angina pectoris: Secondary | ICD-10-CM | POA: Diagnosis not present

## 2018-03-03 ENCOUNTER — Other Ambulatory Visit: Payer: 59 | Admitting: *Deleted

## 2018-03-03 DIAGNOSIS — E785 Hyperlipidemia, unspecified: Secondary | ICD-10-CM

## 2018-03-03 LAB — LIPID PANEL
CHOLESTEROL TOTAL: 173 mg/dL (ref 100–199)
Chol/HDL Ratio: 2.7 ratio (ref 0.0–5.0)
HDL: 64 mg/dL (ref >39)
LDL CALC: 95 mg/dL (ref 0–99)
Triglycerides: 68 mg/dL (ref 0–149)
VLDL Cholesterol Cal: 14 mg/dL (ref 5–40)

## 2018-03-05 ENCOUNTER — Other Ambulatory Visit: Payer: Self-pay | Admitting: *Deleted

## 2018-03-05 DIAGNOSIS — E785 Hyperlipidemia, unspecified: Secondary | ICD-10-CM

## 2018-03-10 DIAGNOSIS — H353211 Exudative age-related macular degeneration, right eye, with active choroidal neovascularization: Secondary | ICD-10-CM | POA: Diagnosis not present

## 2018-03-31 ENCOUNTER — Ambulatory Visit
Admission: RE | Admit: 2018-03-31 | Discharge: 2018-03-31 | Disposition: A | Discharge status: Home / Self Care | Payer: 59 | Source: Ambulatory Visit | Attending: Acute Care | Admitting: Acute Care

## 2018-03-31 DIAGNOSIS — Z87891 Personal history of nicotine dependence: Secondary | ICD-10-CM | POA: Diagnosis not present

## 2018-04-07 DIAGNOSIS — H43813 Vitreous degeneration, bilateral: Secondary | ICD-10-CM | POA: Diagnosis not present

## 2018-04-07 DIAGNOSIS — H33192 Other retinoschisis and retinal cysts, left eye: Secondary | ICD-10-CM | POA: Diagnosis not present

## 2018-04-07 DIAGNOSIS — H35423 Microcystoid degeneration of retina, bilateral: Secondary | ICD-10-CM | POA: Diagnosis not present

## 2018-04-08 ENCOUNTER — Other Ambulatory Visit: Payer: Self-pay | Admitting: Acute Care

## 2018-04-08 DIAGNOSIS — Z87891 Personal history of nicotine dependence: Secondary | ICD-10-CM

## 2018-04-08 DIAGNOSIS — Z122 Encounter for screening for malignant neoplasm of respiratory organs: Secondary | ICD-10-CM

## 2018-04-11 DIAGNOSIS — H353221 Exudative age-related macular degeneration, left eye, with active choroidal neovascularization: Secondary | ICD-10-CM | POA: Diagnosis not present

## 2018-05-05 DIAGNOSIS — H353231 Exudative age-related macular degeneration, bilateral, with active choroidal neovascularization: Secondary | ICD-10-CM | POA: Diagnosis not present

## 2018-05-05 DIAGNOSIS — H353211 Exudative age-related macular degeneration, right eye, with active choroidal neovascularization: Secondary | ICD-10-CM | POA: Diagnosis not present

## 2018-05-15 DIAGNOSIS — H353221 Exudative age-related macular degeneration, left eye, with active choroidal neovascularization: Secondary | ICD-10-CM | POA: Diagnosis not present

## 2018-06-09 ENCOUNTER — Other Ambulatory Visit: Payer: 59 | Admitting: *Deleted

## 2018-06-09 DIAGNOSIS — H35423 Microcystoid degeneration of retina, bilateral: Secondary | ICD-10-CM | POA: Diagnosis not present

## 2018-06-09 DIAGNOSIS — H353231 Exudative age-related macular degeneration, bilateral, with active choroidal neovascularization: Secondary | ICD-10-CM | POA: Diagnosis not present

## 2018-06-09 DIAGNOSIS — H353211 Exudative age-related macular degeneration, right eye, with active choroidal neovascularization: Secondary | ICD-10-CM | POA: Diagnosis not present

## 2018-06-09 DIAGNOSIS — H33192 Other retinoschisis and retinal cysts, left eye: Secondary | ICD-10-CM | POA: Diagnosis not present

## 2018-06-09 DIAGNOSIS — E785 Hyperlipidemia, unspecified: Secondary | ICD-10-CM | POA: Diagnosis not present

## 2018-06-09 DIAGNOSIS — H43813 Vitreous degeneration, bilateral: Secondary | ICD-10-CM | POA: Diagnosis not present

## 2018-06-09 LAB — LIPID PANEL
CHOLESTEROL TOTAL: 174 mg/dL (ref 100–199)
Chol/HDL Ratio: 2.5 ratio (ref 0.0–5.0)
HDL: 69 mg/dL (ref >39)
LDL CALC: 92 mg/dL (ref 0–99)
TRIGLYCERIDES: 63 mg/dL (ref 0–149)
VLDL Cholesterol Cal: 13 mg/dL (ref 5–40)

## 2018-06-12 ENCOUNTER — Telehealth: Payer: Self-pay | Admitting: Internal Medicine

## 2018-06-12 DIAGNOSIS — E785 Hyperlipidemia, unspecified: Secondary | ICD-10-CM

## 2018-06-12 MED ORDER — EZETIMIBE 10 MG PO TABS: 5.0000 mg | ORAL_TABLET | Freq: Every day | ORAL | 3 refills | Status: DC

## 2018-06-12 NOTE — Telephone Encounter (Signed)
Reviewed with patient. Will try Zetia 5 mg daily and continue to take Crestor 10 mg daily Lab appointment for 10/13/18 scheduled.

## 2018-06-12 NOTE — Telephone Encounter (Signed)
    Patient calling for lab results 

## 2018-06-16 DIAGNOSIS — H33193 Other retinoschisis and retinal cysts, bilateral: Secondary | ICD-10-CM | POA: Diagnosis not present

## 2018-06-16 DIAGNOSIS — H353221 Exudative age-related macular degeneration, left eye, with active choroidal neovascularization: Secondary | ICD-10-CM | POA: Diagnosis not present

## 2018-06-16 DIAGNOSIS — H35361 Drusen (degenerative) of macula, right eye: Secondary | ICD-10-CM | POA: Diagnosis not present

## 2018-07-14 DIAGNOSIS — H33192 Other retinoschisis and retinal cysts, left eye: Secondary | ICD-10-CM | POA: Diagnosis not present

## 2018-07-14 DIAGNOSIS — H43813 Vitreous degeneration, bilateral: Secondary | ICD-10-CM | POA: Diagnosis not present

## 2018-07-14 DIAGNOSIS — H35423 Microcystoid degeneration of retina, bilateral: Secondary | ICD-10-CM | POA: Diagnosis not present

## 2018-07-28 DIAGNOSIS — H353221 Exudative age-related macular degeneration, left eye, with active choroidal neovascularization: Secondary | ICD-10-CM | POA: Diagnosis not present

## 2018-07-28 DIAGNOSIS — H353231 Exudative age-related macular degeneration, bilateral, with active choroidal neovascularization: Secondary | ICD-10-CM | POA: Diagnosis not present

## 2018-08-18 DIAGNOSIS — H33192 Other retinoschisis and retinal cysts, left eye: Secondary | ICD-10-CM | POA: Diagnosis not present

## 2018-08-18 DIAGNOSIS — H43813 Vitreous degeneration, bilateral: Secondary | ICD-10-CM | POA: Diagnosis not present

## 2018-08-18 DIAGNOSIS — H35423 Microcystoid degeneration of retina, bilateral: Secondary | ICD-10-CM | POA: Diagnosis not present

## 2018-09-11 DIAGNOSIS — H353221 Exudative age-related macular degeneration, left eye, with active choroidal neovascularization: Secondary | ICD-10-CM | POA: Diagnosis not present

## 2018-09-14 DIAGNOSIS — Z23 Encounter for immunization: Secondary | ICD-10-CM | POA: Diagnosis not present

## 2018-09-22 DIAGNOSIS — H353231 Exudative age-related macular degeneration, bilateral, with active choroidal neovascularization: Secondary | ICD-10-CM | POA: Diagnosis not present

## 2018-09-22 DIAGNOSIS — H33192 Other retinoschisis and retinal cysts, left eye: Secondary | ICD-10-CM | POA: Diagnosis not present

## 2018-09-22 DIAGNOSIS — H35423 Microcystoid degeneration of retina, bilateral: Secondary | ICD-10-CM | POA: Diagnosis not present

## 2018-09-22 DIAGNOSIS — H353211 Exudative age-related macular degeneration, right eye, with active choroidal neovascularization: Secondary | ICD-10-CM | POA: Diagnosis not present

## 2018-09-22 DIAGNOSIS — H43813 Vitreous degeneration, bilateral: Secondary | ICD-10-CM | POA: Diagnosis not present

## 2018-10-13 ENCOUNTER — Other Ambulatory Visit: Payer: 59

## 2019-04-24 IMAGING — CT CT HEART SCORING
2 series · 15 of 20 positions shown, 17 images · non-contrast
Comparison: 02/22/2017

CLINICAL DATA: Risk stratification

EXAM:
Coronary Calcium Score
TECHNIQUE: The patient was scanned on a Siemens Somatom 64 slice scanner. Axial
non-contrast 3 mm slices were carried out through the heart. The
data set was analyzed on a dedicated work station and scored using
the Agatson method.

[Series 3: casc 3.0 i36f 2 bestdiast 73 % · axial · 0.35mm/px · z∈[+1186,+1288]mm · 7 of 46 slices shown, 9 images]
[im 6/46  vessel]
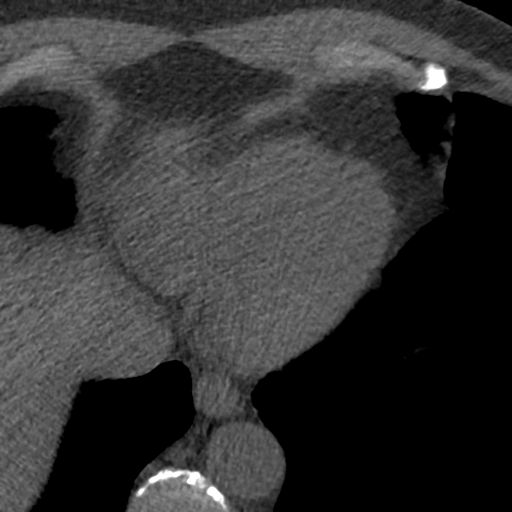
[im 6/46  lung]
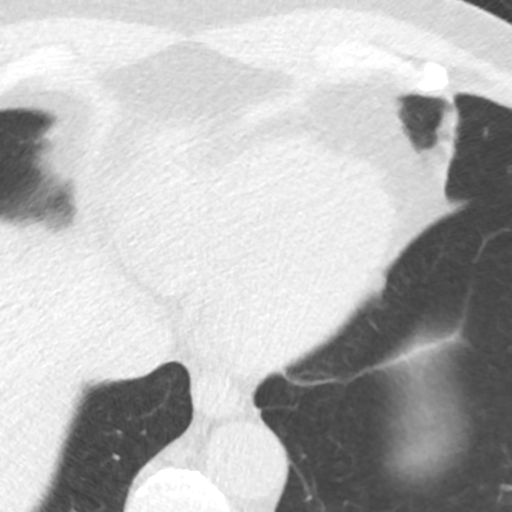
[im 12/46  vessel]
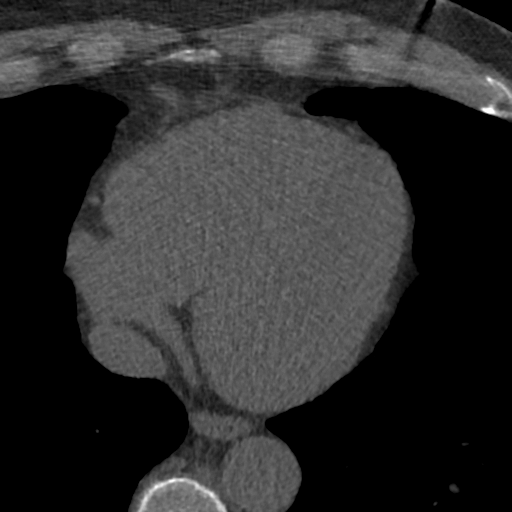
[im 17/46  vessel]
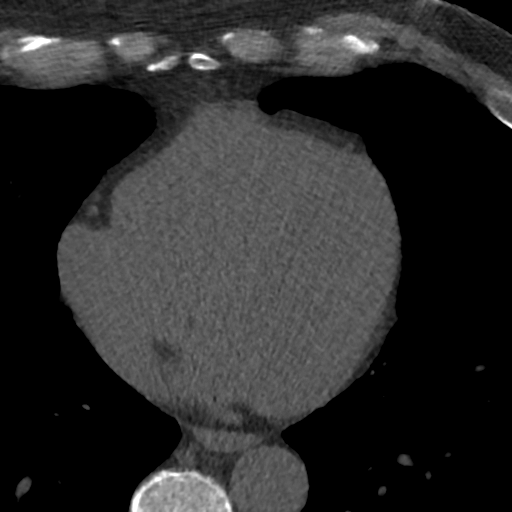
[im 23/46  vessel]
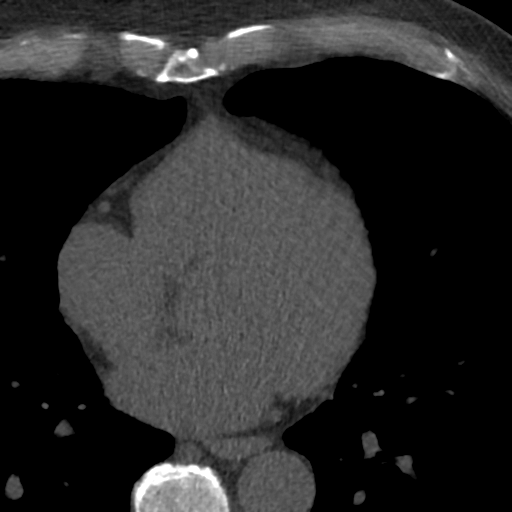
[im 29/46  vessel]
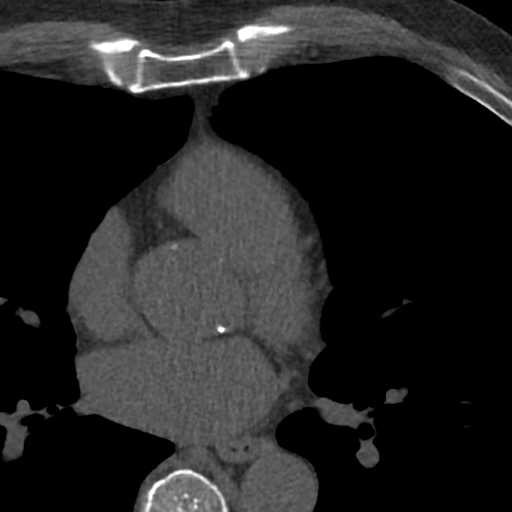
[im 29/46  lung]
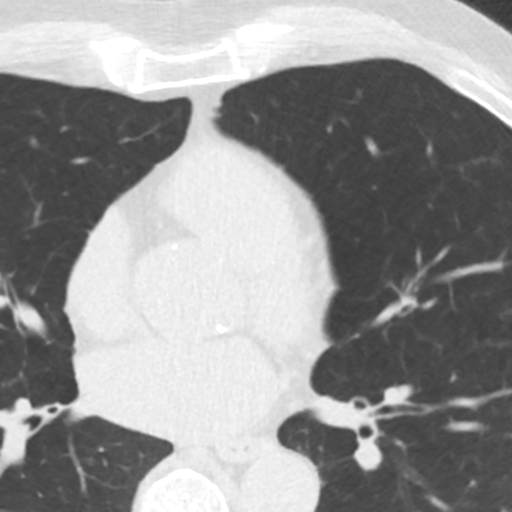
[im 34/46  vessel]
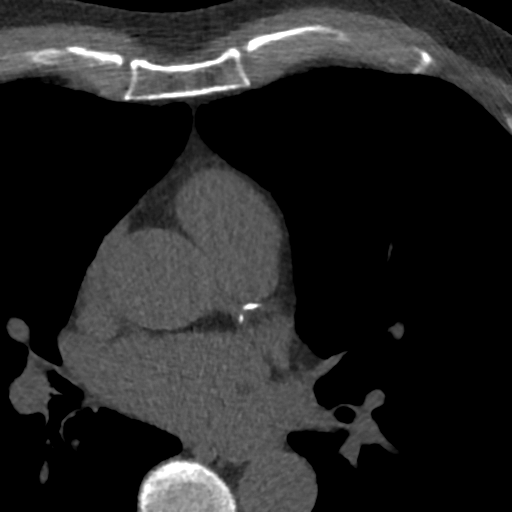
[im 40/46  vessel]
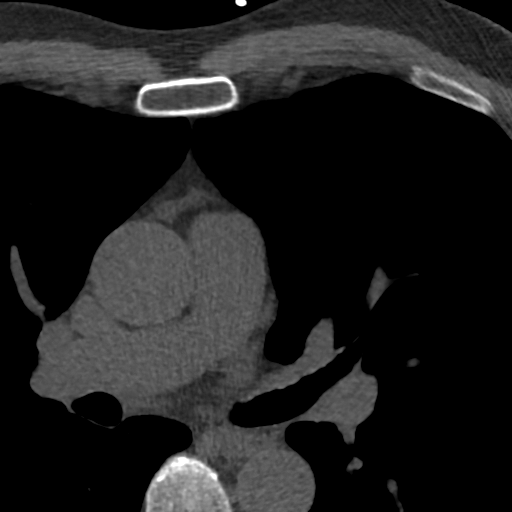

[Series 5: lung st 71 % · axial · 0.82mm/px · z∈[+1186,+1306]mm · 8 of 52 slices shown]
[im 6/52  lung]
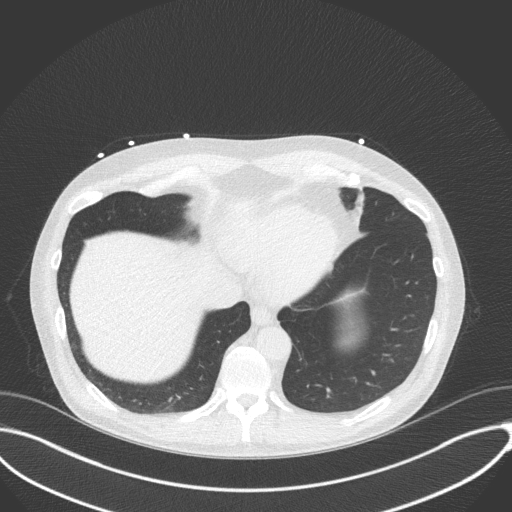
[im 11/52  lung]
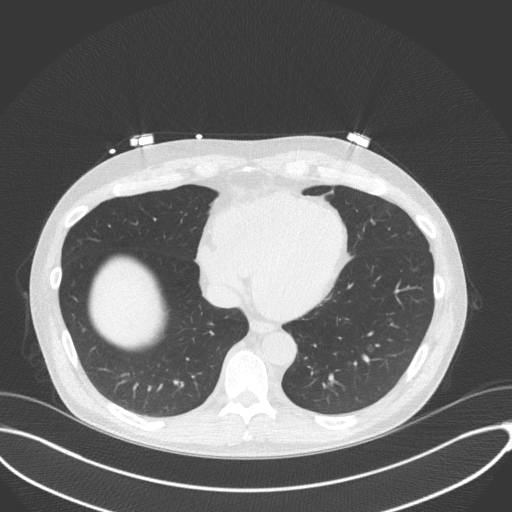
[im 16/52  lung]
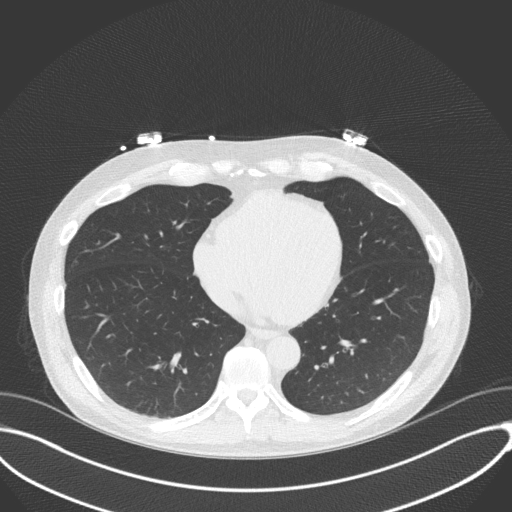
[im 21/52  lung]
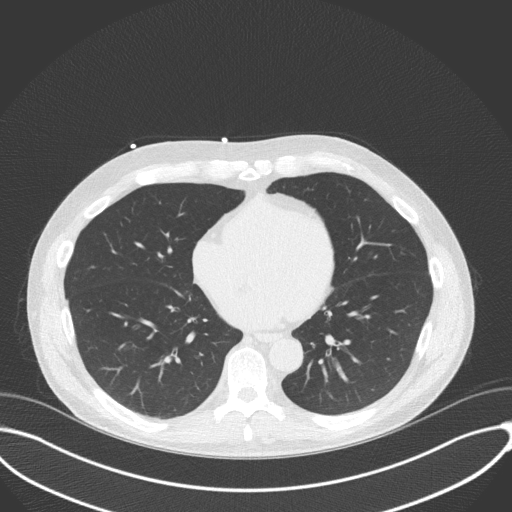
[im 31/52  lung]
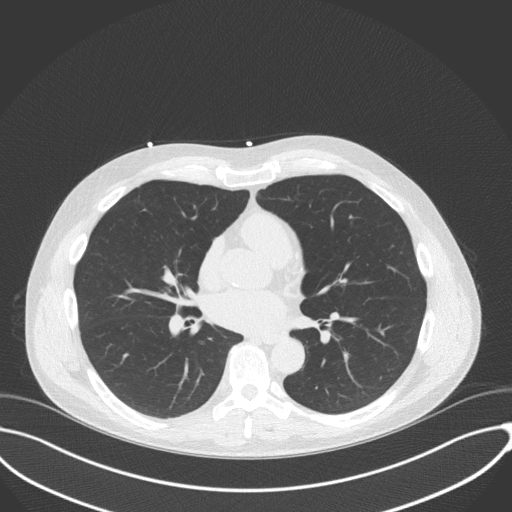
[im 36/52  lung]
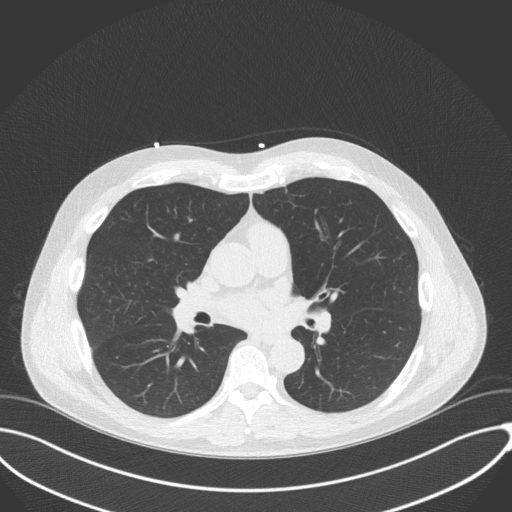
[im 41/52  lung]
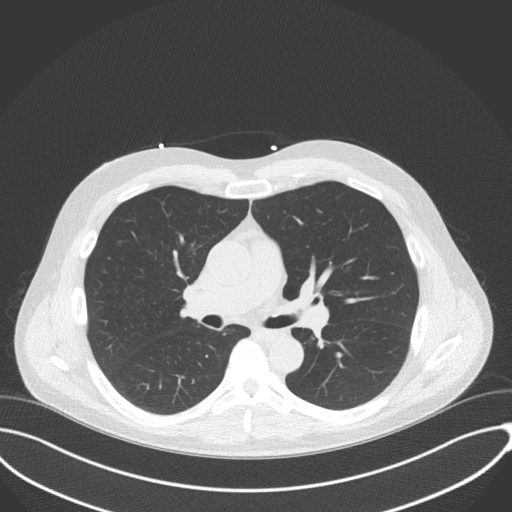
[im 46/52  lung]
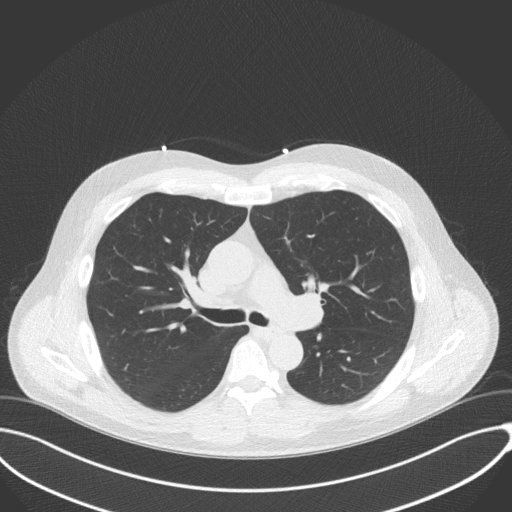

[15 of 20 positions shown; findings below may reference images not displayed]

FINDINGS: Non-cardiac: See separate report from [REDACTED].

Ascending Aorta:  Aortic atherosclerosis Root measures 3.6 cm

Pericardium: Normal

Coronary arteries:  Moderate calcium in proximal and mid LAD and RCA
IMPRESSION: Coronary calcium score of 138 . This was 66 th percentile for age
and sex matched control.

Azman Surat

EXAM:
OVER-READ INTERPRETATION  CT CHEST

The following report is an over-read performed by radiologist Dr.
Kory Navarrete [REDACTED] on 05/17/2017. This over-read
does not include interpretation of cardiac or coronary anatomy or
pathology. The coronary calcium score interpretation by the
cardiologist is attached.
FINDINGS: Cardiovascular: Heart is normal size. Aorta is normal caliber.
Scattered aortic root calcifications.

Mediastinum/Nodes: Small mediastinal lymph nodes. No pathologically
enlarged.

Lungs/Pleura: Visualized lungs clear.  No effusions.

Upper Abdomen: Imaging into the upper abdomen shows no acute
findings.

Musculoskeletal: Chest wall soft tissues are unremarkable. No acute
bony abnormality.
IMPRESSION: No acute extracardiac abnormality.

Atherosclerotic calcifications in the aortic root.

## 2019-05-25 ENCOUNTER — Other Ambulatory Visit: Payer: Self-pay | Admitting: Family Medicine

## 2019-05-25 DIAGNOSIS — Z87891 Personal history of nicotine dependence: Secondary | ICD-10-CM

## 2019-05-29 ENCOUNTER — Ambulatory Visit
Admission: RE | Admit: 2019-05-29 | Discharge: 2019-05-29 | Disposition: A | Discharge status: Home / Self Care | Payer: 59 | Source: Ambulatory Visit | Attending: Family Medicine | Admitting: Family Medicine

## 2019-05-29 DIAGNOSIS — Z87891 Personal history of nicotine dependence: Secondary | ICD-10-CM

## 2019-07-13 ENCOUNTER — Other Ambulatory Visit: Payer: Self-pay | Admitting: *Deleted

## 2019-07-13 DIAGNOSIS — Z122 Encounter for screening for malignant neoplasm of respiratory organs: Secondary | ICD-10-CM

## 2019-07-13 DIAGNOSIS — Z87891 Personal history of nicotine dependence: Secondary | ICD-10-CM

## 2019-07-22 ENCOUNTER — Ambulatory Visit
Admission: RE | Admit: 2019-07-22 | Discharge: 2019-07-22 | Disposition: A | Discharge status: Home / Self Care | Payer: 59 | Source: Ambulatory Visit | Attending: Acute Care | Admitting: Acute Care

## 2019-07-22 DIAGNOSIS — Z87891 Personal history of nicotine dependence: Secondary | ICD-10-CM

## 2019-07-22 DIAGNOSIS — Z122 Encounter for screening for malignant neoplasm of respiratory organs: Secondary | ICD-10-CM

## 2019-07-29 ENCOUNTER — Telehealth: Payer: Self-pay | Admitting: Acute Care

## 2019-07-29 DIAGNOSIS — Z87891 Personal history of nicotine dependence: Secondary | ICD-10-CM

## 2019-07-29 DIAGNOSIS — Z122 Encounter for screening for malignant neoplasm of respiratory organs: Secondary | ICD-10-CM

## 2019-07-29 NOTE — Telephone Encounter (Signed)
LMTC x 1  

## 2019-07-29 NOTE — Telephone Encounter (Signed)
Pt informed of CT results per Sarah Groce, NP.  PT verbalized understanding.  Copy sent to PCP.  Order placed for 1 yr f/u CT.  

## 2019-10-25 ENCOUNTER — Ambulatory Visit: Payer: 59 | Attending: Internal Medicine

## 2019-10-25 DIAGNOSIS — Z23 Encounter for immunization: Secondary | ICD-10-CM | POA: Insufficient documentation

## 2019-10-25 NOTE — Progress Notes (Signed)
   Covid-19 Vaccination Clinic  Name:  [name]    MRN: [id number] DOB: 03-21-1951  10/25/2019  [name] was observed post Covid-19 immunization for 15 minutes without incidence. He was provided with Vaccine Information Sheet and instruction to access the V-Safe system.   [name] was instructed to call 911 with any severe reactions post vaccine: Marland Kitchen Difficulty breathing  . Swelling of your face and throat  . A fast heartbeat  . A bad rash all over your body  . Dizziness and weakness    Immunizations Administered    Name Date Dose VIS Date Route   Pfizer COVID-19 Vaccine 10/25/2019  8:56 AM 0.3 mL 08/21/2019 Intramuscular   Manufacturer: [employer], [employer]   Lot: [id number]   NDC: [address]-7

## 2019-11-16 ENCOUNTER — Ambulatory Visit: Payer: 59 | Attending: Internal Medicine

## 2019-11-16 DIAGNOSIS — Z23 Encounter for immunization: Secondary | ICD-10-CM | POA: Insufficient documentation

## 2019-11-16 NOTE — Progress Notes (Signed)
   Covid-19 Vaccination Clinic  Name:  [name]    MRN: [id number] DOB: 09-10-1951  11/16/2019  [name] was observed post Covid-19 immunization for 15 minutes without incident. He was provided with Vaccine Information Sheet and instruction to access the V-Safe system.   [name] was instructed to call 911 with any severe reactions post vaccine: Marland Kitchen Difficulty breathing  . Swelling of face and throat  . A fast heartbeat  . A bad rash all over body  . Dizziness and weakness   Immunizations Administered    Name Date Dose VIS Date Route   Pfizer COVID-19 Vaccine 11/16/2019  5:12 PM 0.3 mL 08/21/2019 Intramuscular   Manufacturer: [employer], [employer]   Lot: [id number]   NDC: [address]-4

## 2020-07-27 ENCOUNTER — Other Ambulatory Visit: Payer: Self-pay

## 2020-07-27 ENCOUNTER — Ambulatory Visit
Admission: RE | Admit: 2020-07-27 | Discharge: 2020-07-27 | Disposition: A | Discharge status: Home / Self Care | Payer: 59 | Source: Ambulatory Visit | Attending: Acute Care | Admitting: Acute Care

## 2020-07-27 DIAGNOSIS — Z122 Encounter for screening for malignant neoplasm of respiratory organs: Secondary | ICD-10-CM

## 2020-07-27 DIAGNOSIS — Z87891 Personal history of nicotine dependence: Secondary | ICD-10-CM

## 2020-07-28 NOTE — Progress Notes (Signed)
Please call patient and let them  know their  low dose Ct was read as a Lung RADS 2: nodules that are benign in appearance and behavior with a very low likelihood of becoming a clinically active cancer due to size or lack of growth. Recommendation per radiology is for a repeat LDCT in 12 months. .Please let them  know we will order and schedule their  annual screening scan for 07/2021. Please let them  know there was notation of CAD on their  scan.  Please remind the patient  that this is a non-gated exam therefore degree or severity of disease  cannot be determined. Please have them  follow up with their PCP regarding potential risk factor modification, dietary therapy or pharmacologic therapy if clinically indicated. Pt.  is  currently on statin therapy. Please place order for annual  screening scan for  07/2021 and fax results to PCP. Thanks so much.  Pt. Is followed by cards

## 2020-07-29 ENCOUNTER — Other Ambulatory Visit: Payer: Self-pay | Admitting: *Deleted

## 2020-07-29 DIAGNOSIS — Z87891 Personal history of nicotine dependence: Secondary | ICD-10-CM

## 2021-07-17 DIAGNOSIS — H353211 Exudative age-related macular degeneration, right eye, with active choroidal neovascularization: Secondary | ICD-10-CM | POA: Diagnosis not present

## 2021-07-17 DIAGNOSIS — H33193 Other retinoschisis and retinal cysts, bilateral: Secondary | ICD-10-CM | POA: Diagnosis not present

## 2021-07-17 DIAGNOSIS — H35372 Puckering of macula, left eye: Secondary | ICD-10-CM | POA: Diagnosis not present

## 2021-07-17 DIAGNOSIS — H353121 Nonexudative age-related macular degeneration, left eye, early dry stage: Secondary | ICD-10-CM | POA: Diagnosis not present

## 2021-07-27 DIAGNOSIS — H353211 Exudative age-related macular degeneration, right eye, with active choroidal neovascularization: Secondary | ICD-10-CM | POA: Diagnosis not present

## 2021-07-27 DIAGNOSIS — H353231 Exudative age-related macular degeneration, bilateral, with active choroidal neovascularization: Secondary | ICD-10-CM | POA: Diagnosis not present

## 2021-07-27 DIAGNOSIS — H43813 Vitreous degeneration, bilateral: Secondary | ICD-10-CM | POA: Diagnosis not present

## 2021-07-27 DIAGNOSIS — H35423 Microcystoid degeneration of retina, bilateral: Secondary | ICD-10-CM | POA: Diagnosis not present

## 2021-08-07 DIAGNOSIS — H353221 Exudative age-related macular degeneration, left eye, with active choroidal neovascularization: Secondary | ICD-10-CM | POA: Diagnosis not present

## 2021-08-28 ENCOUNTER — Other Ambulatory Visit: Payer: Self-pay | Admitting: Family Medicine

## 2021-08-28 DIAGNOSIS — Z87891 Personal history of nicotine dependence: Secondary | ICD-10-CM

## 2021-09-17 NOTE — Progress Notes (Signed)
Chief Complaint  Patient presents with   New Patient (Initial Visit)    Hyperlipidemia    History of Present Illness: 72 yo male with history of hyperlipidemia, PACs, former tobacco abuse, COPD, BPH and coronary artery disease who is here today to re-establish cardiology care. He has been followed in our office by Dr. Tenny Craw but has not been seen in our office since 2018. CT coronary calcium score in 2018 of 138. He has not had a cardiac cath. He has not tolerated any statins. He was seen in the lipid clinic in 2018 but did not wish to try Repatha. He stopped smoking in 2018. He tells me today that he tried statins but has not tolerated due to muscle aches and weakness. He is very active and exercises daily. He has no chest pain or dyspnea.   Primary Care Physician: Catha Gosselin, MD Primary Cardiology: Tenny Craw  Past Medical History:  Diagnosis Date   Aortic atherosclerosis (HCC) 08/21/2013   Seen on CT scan   Aortic calcification (HCC)    Back pain    BPH (benign prostatic hyperplasia)    BPH (benign prostatic hyperplasia)    COPD (chronic obstructive pulmonary disease) (HCC)    Coronary artery calcification 08/21/2013   LAD/RCA-calcium score 160 in 2014   Depression    Dyslipidemia    Elevated fasting glucose    Elevated fasting glucose    History of colon polyps    Hyperlipidemia    Low back pain    Macular degeneration, wet (HCC)    Macular degeneration, wet (HCC)    PAC (premature atrial contraction)    PAC (premature atrial contraction)    Personal history of colonic polyps    Tobacco abuse     Past Surgical History:  Procedure Laterality Date   BLEPHAROPLASTY     C5-C6 and C6-C7 anterior cervical discectomy     with fusion and plating in 2009 with neurosurgeon Dr. Lovell Sheehan   ELBOW SURGERY      Current Outpatient Medications  Medication Sig Dispense Refill   aspirin EC 81 MG tablet Take 1 tablet (81 mg total) by mouth daily. Swallow whole. 90 tablet 3   finasteride  (PROSCAR) 5 MG tablet Take 5 mg by mouth daily.     losartan (COZAAR) 50 MG tablet Take 50 mg by mouth daily.     No current facility-administered medications for this visit.    Allergies  Allergen Reactions   Prilosec [Omeprazole]    Statins     CAUSES EXTREME  MUSCLE AND JOINT PAIN    Social History   Socioeconomic History   Marital status: Married    Spouse name: Not on file   Number of children: Not on file   Years of education: Not on file   Highest education level: Not on file  Occupational History   Occupation: Retired  Tobacco Use   Smoking status: Former    Packs/day: 1.00    Years: 32.00    Pack years: 32.00    Types: Cigarettes    Start date: 07/08/1983    Quit date: 02/08/2017    Years since quitting: 4.6   Smokeless tobacco: Never  Substance and Sexual Activity   Alcohol use: Yes    Alcohol/week: 0.0 standard drinks    Comment: LESS THAN A DRINK A DAY   Drug use: No   Sexual activity: Not on file  Other Topics Concern   Not on file  Social History Narrative   Not  on file   Social Determinants of Health   Financial Resource Strain: Not on file  Food Insecurity: Not on file  Transportation Needs: Not on file  Physical Activity: Not on file  Stress: Not on file  Social Connections: Not on file  Intimate Partner Violence: Not on file    Family History  Problem Relation Age of Onset   Heart disease Mother    Dementia Mother    Heart attack Father    Irregular heart beat Brother     Review of Systems:  As stated in the HPI and otherwise negative.   BP 140/70    Pulse (!) 59    Ht 5\' 6"  (1.676 m)    Wt 176 lb (79.8 kg)    SpO2 99%    BMI 28.41 kg/m   Physical Examination: General: Well developed, well nourished, NAD  HEENT: OP clear, mucus membranes moist  SKIN: warm, dry. No rashes. Neuro: No focal deficits  Musculoskeletal: Muscle strength 5/5 all ext  Psychiatric: Mood and affect normal  Neck: No JVD, no carotid bruits, no thyromegaly,  no lymphadenopathy.  Lungs:Clear bilaterally, no wheezes, rhonci, crackles Cardiovascular: Regular rate and rhythm. No murmurs, gallops or rubs. Abdomen:Soft. Bowel sounds present. Non-tender.  Extremities: No lower extremity edema. Pulses are 2 + in the bilateral DP/PT.  EKG:  EKG is ordered today. The ekg ordered today demonstrates Sinus, PACs  Recent Labs: No results found for requested labs within last 8760 hours.   Lipid Panel    Component Value Date/Time   CHOL 174 06/09/2018 0733   TRIG 63 06/09/2018 0733   HDL 69 06/09/2018 0733   CHOLHDL 2.5 06/09/2018 0733   LDLCALC 92 06/09/2018 0733     Wt Readings from Last 3 Encounters:  09/18/21 176 lb (79.8 kg)  05/06/17 169 lb 8 oz (76.9 kg)  08/21/13 158 lb (71.7 kg)      Assessment and Plan:   1. Hyperlipidemia: He has not tolerated statins. Last LDL 126 in October 2022 with HDL 62. Will refer to the lipid clinic to discuss Repatha or Praluent.   2. CAD without angina: He had evidence of CAD with abnormal coronary calcium score in 2018. He is very active. No chest pain. Will start ASA 81 mg daily. Statin intolerant-see above.   Current medicines are reviewed at length with the patient today.  The patient does not have concerns regarding medicines.  The following changes have been made:  no change  Labs/ tests ordered today include:   Orders Placed This Encounter  Procedures   AMB Referral to [facility] Pharm-[facility]   EKG 12-Lead     Disposition:   F/U with Dr. [facility] in one year.    Signed, Tenny Craw, MD 09/18/2021 9:33 AM    [facility] [address] [place], [place], [place]  [place] Phone: [phone]; Fax: [phone]

## 2021-09-18 ENCOUNTER — Other Ambulatory Visit: Payer: Self-pay

## 2021-09-18 ENCOUNTER — Ambulatory Visit (INDEPENDENT_AMBULATORY_CARE_PROVIDER_SITE_OTHER): Payer: 59 | Admitting: Cardiovascular Disease

## 2021-09-18 ENCOUNTER — Encounter: Payer: Self-pay | Admitting: Cardiovascular Disease

## 2021-09-18 VITALS — BP 140/70 | HR 59 | Ht 66.0 in | Wt 176.0 lb

## 2021-09-18 DIAGNOSIS — E7800 Pure hypercholesterolemia, unspecified: Secondary | ICD-10-CM | POA: Diagnosis not present

## 2021-09-18 DIAGNOSIS — I2510 Atherosclerotic heart disease of native coronary artery without angina pectoris: Secondary | ICD-10-CM

## 2021-09-18 MED ORDER — ASPIRIN EC 81 MG PO TBEC: 81.0000 mg | DELAYED_RELEASE_TABLET | Freq: Every day | ORAL | 3 refills | Status: AC

## 2021-09-18 NOTE — Patient Instructions (Signed)
Medication Instructions:  Your physician has recommended you make the following change in your medication:  1.) start aspirin 81 mg (enteric coated) - one tablet daily  *If you need a refill on your cardiac medications before your next appointment, please call your pharmacy*   Lab Work: None today.   Testing/Procedures: none   Follow-Up: At [facility], you and your health needs are our priority.  As part of our continuing mission to provide you with exceptional heart care, we have created designated Provider Care Teams.  These Care Teams include your primary Cardiologist (physician) and Advanced Practice Providers (APPs -  Physician Assistants and Nurse Practitioners) who all work together to provide you with the care you need, when you need it.  Your next appointment:   12 month(s)  The format for your next appointment:   In Person  Provider:   Dietrich Pates, MD     Other Instructions You have been referred to Lipid Clinic PharmD for lipid management.

## 2021-09-26 ENCOUNTER — Other Ambulatory Visit: Payer: Self-pay

## 2021-09-26 ENCOUNTER — Ambulatory Visit
Admission: RE | Admit: 2021-09-26 | Discharge: 2021-09-26 | Disposition: A | Discharge status: Home / Self Care | Payer: 59 | Source: Ambulatory Visit | Attending: Family Medicine | Admitting: Family Medicine

## 2021-09-26 DIAGNOSIS — Z87891 Personal history of nicotine dependence: Secondary | ICD-10-CM

## 2021-10-02 NOTE — Progress Notes (Signed)
Patient ID: RUDRANSH BELLANCA                 DOB: [birth year]                    MRN: [id number]    HPI: [name] is a 71 y.o. male patient referred to lipid clinic by Dr. Clifton James. PMH is significant for CAD, HLD, COPD, BPH. Pt had an elevated calcium score of 160 in 2014. This was repeated in 05/2017 and revealed a calcium score of 138 which was 66th percentile when matched for age and sex. Moderate calcium seen in the proximal and mid LAD and RCA. Intolerant to multiple statins and ezetimibe ineffective. Seen in lipid clinic in 2018 but did not wish to try Repatha.   Today, patient arrives in good spirits. Discusses history of statin use, reporting symptoms starting within a week of starting the medication. States that while he was able to initially  tolerate lower doses of rosuvastatin, he ultimately started to have muscle pain with them too. Also reports that ezetimibe had been ineffective at lowering his cholesterol. He is currently not on any medications for hyperlipidemia. He lives a very active lifestyle but does have a family history of heart disease in both parents.   Current Medications: none Intolerances: atorvastatin, rosuvastatin, & pravastatin (muscle and joint pain); ezetimibe (ineffective) Risk Factors: aortic atherosclerosis, elevated calcium score, smoking, family history LDL goal: <70 mg/dL  Diet: eats raw vegetables, mixed beans, no more than 2 oz of chicken for lunch and dinner  Exercise: weight lifting + cardio 5-6x/week   Family History: Heart disease in mother (8s), heart attack in father (age 63)   Social History: Former smoker (quit 2018)  Labs: 06/30/21: TC 206, TG 103, HDL 62, LDL 126 (no lipid lowering medications)  Past Medical History:  Diagnosis Date   Aortic atherosclerosis (HCC) 08/21/2013   Seen on CT scan   Aortic calcification (HCC)    Back pain    BPH (benign prostatic hyperplasia)    BPH (benign prostatic hyperplasia)    COPD (chronic  obstructive pulmonary disease) (HCC)    Coronary artery calcification 08/21/2013   LAD/RCA-calcium score 160 in 2014   Depression    Dyslipidemia    Elevated fasting glucose    Elevated fasting glucose    History of colon polyps    Hyperlipidemia    Low back pain    Macular degeneration, wet (HCC)    Macular degeneration, wet (HCC)    PAC (premature atrial contraction)    PAC (premature atrial contraction)    Personal history of colonic polyps    Tobacco abuse     Current Outpatient Medications on File Prior to Visit  Medication Sig Dispense Refill   aspirin EC 81 MG tablet Take 1 tablet (81 mg total) by mouth daily. Swallow whole. 90 tablet 3   finasteride (PROSCAR) 5 MG tablet Take 5 mg by mouth daily.     losartan (COZAAR) 50 MG tablet Take 50 mg by mouth daily.     No current facility-administered medications on file prior to visit.    Allergies  Allergen Reactions   Prilosec [Omeprazole]    Statins     CAUSES EXTREME  MUSCLE AND JOINT PAIN    Assessment/Plan:  1. Hyperlipidemia - Most recent LDL above goal <70 mg/dL. He is intolerant to multiple statins and not seen successful lowering with ezetimibe. Would benefit from PCSK9i to lower LDL to goal and  help to reduce cardiac risk given aortic atherosclerosis and family history of ASCVD. Will submit prior authorization for Repatha and update the patient when we have heard back on this. Will recheck fasting labs 6-8 weeks after starting medication.   Pervis Hocking, PharmD PGY2 Ambulatory Care Pharmacy Resident 10/03/2021 2:00 PM

## 2021-10-03 ENCOUNTER — Other Ambulatory Visit: Payer: Self-pay

## 2021-10-03 ENCOUNTER — Ambulatory Visit (INDEPENDENT_AMBULATORY_CARE_PROVIDER_SITE_OTHER): Payer: 59 | Admitting: Student-PharmD

## 2021-10-03 ENCOUNTER — Telehealth: Payer: Self-pay | Admitting: Student-PharmD

## 2021-10-03 ENCOUNTER — Encounter: Payer: Self-pay | Admitting: Student-PharmD

## 2021-10-03 DIAGNOSIS — E785 Hyperlipidemia, unspecified: Secondary | ICD-10-CM

## 2021-10-03 DIAGNOSIS — I700 Atherosclerosis of aorta: Secondary | ICD-10-CM

## 2021-10-03 NOTE — Telephone Encounter (Signed)
PA for Repatha was denied for the following reason:   "This decision is based on health plan criteria for Repatha [employer]. This medicine is covered only if: The following diagnosis: (II) Atherosclerotic cardiovascular disease as confirmed by one of the following: (a) Acute coronary syndromes. (b) History of myocardial infarction. (c) Stable or unstable angina. (d) Coronary or other arterial revascularization. (e) Stroke. (f) Transient ischemic attack. (g) Peripheral arterial disease presumed to be of atherosclerotic origin."  Will submit appeal once the patient has returned authorization to submit appeal on his behalf.

## 2021-10-03 NOTE — Patient Instructions (Addendum)
Nice to see you today!  Keep up the good work with diet and exercise. Aim for a diet full of vegetables, fruit and lean meats (chicken, [place], fish). Try to limit carbs (bread, pasta, sugar, rice) and red meat consumption.  Your goal LDL is less than 70 mg/dL, you're currently at 440 mg/dL  Medication Changes:  I will submit a prior authorization to your insurance for Repatha. I will give you a call when this is approved and sent to your pharmacy. You can sign up for a copay card online by googling Repatha or Praluent copay card, depending on which one we start, and enter your information. It will give you some numbers that you'll need to show your pharmacy.   Please give [place] a call at [phone] with any questions or concerns.  For Repatha or Praluent, inject once every other week (any day of the week that works for you) into the fatty skin of stomach, upper outer thigh or back of the arm. Clean the site with soap and warm water or an alcohol pad. Keep the medication in the fridge until you are ready to give your dose, then take it out and let warm up to room temperature for 30-60 mins.

## 2021-10-04 NOTE — Telephone Encounter (Signed)
Patient sent in signed form to provide authorization for [place] to submit appeal on his behalf. Faxed appeal today, 10/04/21. Will follow the status.

## 2021-10-05 ENCOUNTER — Other Ambulatory Visit: Payer: Self-pay | Admitting: *Deleted

## 2021-10-05 DIAGNOSIS — Z87891 Personal history of nicotine dependence: Secondary | ICD-10-CM

## 2021-10-17 NOTE — Telephone Encounter (Signed)
Called for status update, appeal still under review, determination to be made by 2/24.

## 2021-11-07 ENCOUNTER — Telehealth: Payer: Self-pay | Admitting: Student-PharmD

## 2021-11-07 MED ORDER — ROSUVASTATIN CALCIUM 5 MG PO TABS: 5.0000 mg | ORAL_TABLET | Freq: Every day | ORAL | 11 refills | Status: DC

## 2021-11-07 NOTE — Telephone Encounter (Signed)
Appeals for Repatha was denied. Called patient to discuss. He is willing to try a statin again to see if he is able to tolerate it better now than in the past. Per chart review, he has tolerated rosuvastatin at 5 mg which the patient confirms. When it was increased to 10 mg is when he started to have problems. Will restart rosuvastatin at 5 mg daily. Discussed that if he starts to notice any adverse effects he can switch to taking it three times weekly and see if it improves. He will give me a call if he has any trouble. Otherwise he is scheduled for fasting labs on 12/18/21 and we will follow up then.

## 2021-11-27 ENCOUNTER — Other Ambulatory Visit: Payer: 59

## 2021-12-04 ENCOUNTER — Telehealth: Payer: Self-pay | Admitting: Pharmacist

## 2021-12-04 NOTE — Telephone Encounter (Signed)
Patient called and LVM for [name]. States he would like a call back, but it is not urgent. Just at her convenience. ?

## 2021-12-05 MED ORDER — EZETIMIBE 10 MG PO TABS: 10.0000 mg | ORAL_TABLET | Freq: Every day | ORAL | 3 refills | Status: DC

## 2021-12-05 NOTE — Telephone Encounter (Signed)
Called patient back. He reports that he started to have severe aches and pains in his arms after starting rosuvastatin. He had taken it for a few weeks before it started. He stopped taking it 5 days ago and the pain has gone away. Also reports he had a rash under his neck and on his chest that also went away after discontinuing rosuvastatin. Discussed retrial of ezetimibe given limited in other options. He has taken this before and tolerated it well but reported it did not help improve his cholesterol. Will retry this since he cannot tolerate multiple statins and insurance has denied appeal for PCSK9i. He is agreeable to this plan. He continues to eat well and exercise often.  ? ?Rescheduled his lab appt that was planned for 12/18/21 to 01/22/22 for fasting lipid panel.  ?

## 2021-12-18 ENCOUNTER — Other Ambulatory Visit: Payer: 59

## 2022-01-22 ENCOUNTER — Other Ambulatory Visit: Payer: 59 | Admitting: *Deleted

## 2022-01-22 DIAGNOSIS — E785 Hyperlipidemia, unspecified: Secondary | ICD-10-CM

## 2022-01-22 LAB — LIPID PANEL
Chol/HDL Ratio: 2.4 ratio (ref 0.0–5.0)
Cholesterol, Total: 212 mg/dL — ABNORMAL HIGH (ref 100–199)
HDL: 87 mg/dL (ref >39)
LDL Chol Calc (NIH): 106 mg/dL — ABNORMAL HIGH (ref 0–99)
Triglycerides: 111 mg/dL (ref 0–149)
VLDL Cholesterol Cal: 19 mg/dL (ref 5–40)

## 2022-01-22 LAB — HEPATIC FUNCTION PANEL
ALT: 17 IU/L (ref 0–44)
AST: 24 IU/L (ref 0–40)
Albumin: 4.4 g/dL (ref 3.8–4.8)
Alkaline Phosphatase: 52 IU/L (ref 44–121)
Bilirubin Total: 0.5 mg/dL (ref 0.0–1.2)
Bilirubin, Direct: 0.14 mg/dL (ref 0.00–0.40)
Total Protein: 6.6 g/dL (ref 6.0–8.5)

## 2022-09-26 ENCOUNTER — Ambulatory Visit
Admission: RE | Admit: 2022-09-26 | Discharge: 2022-09-26 | Disposition: A | Discharge status: Home / Self Care | Payer: 59 | Source: Ambulatory Visit | Attending: Acute Care | Admitting: Acute Care

## 2022-09-26 DIAGNOSIS — Z87891 Personal history of nicotine dependence: Secondary | ICD-10-CM

## 2022-09-28 ENCOUNTER — Other Ambulatory Visit: Payer: Self-pay

## 2022-09-28 DIAGNOSIS — Z87891 Personal history of nicotine dependence: Secondary | ICD-10-CM

## 2022-09-28 DIAGNOSIS — Z122 Encounter for screening for malignant neoplasm of respiratory organs: Secondary | ICD-10-CM

## 2022-11-21 ENCOUNTER — Ambulatory Visit: Payer: 59 | Admitting: Internal Medicine

## 2022-12-04 ENCOUNTER — Other Ambulatory Visit: Payer: Self-pay | Admitting: *Deleted

## 2022-12-04 MED ORDER — EZETIMIBE 10 MG PO TABS: 10.0000 mg | ORAL_TABLET | Freq: Every day | ORAL | 3 refills | Status: DC

## 2023-03-08 ENCOUNTER — Ambulatory Visit: Payer: 59 | Admitting: Internal Medicine

## 2023-04-12 ENCOUNTER — Ambulatory Visit: Payer: 59 | Admitting: Physician Assistant

## 2023-06-10 NOTE — Progress Notes (Unsigned)
   Cardiology Office Note    Date:  06/10/2023  ID:  Aditya, Nastasi 10-Feb-1951, MRN [id number] PCP:  Catha Gosselin, MD  Cardiologist:  Dietrich Pates, MD  Electrophysiologist:  None   Chief Complaint: ***  History of Present Illness: .    PHU RECORD is a 72 y.o. male with visit-pertinent history of hyperlipidemia, PACs, former tobacco abuse, COPD, BPH and coronary artery disease.  Previously seen by Dr. Tenny Craw in 2018 for hyperlipidemia and CAD evaluation.  He had a CT coronary calcium score in 2018 which showed a calcium score of 138.  He has previously not tolerated any statins and was referred to the lipid clinic in 2018 but did not wish to try Repatha at that time.  He reestablished care in 09/2021.  He denied chest pain or dyspnea.  He reported being very active and exercising daily.  He was referred back to the lipid clinic and was started on ASA 81 mg daily.  He was agreeable to starting Repatha however insurance denied prior Auth.  He was started on rosuvastatin 5 mg daily, which resulted in myalgia as well as a rash that resolved after discontinuing rosuvastatin.  He was agreeable to restarting ezetimibe, he was then lost to follow-up.  CAD: CT coronary calcium score in 2018 showed a calcium score of 138. Stable with no anginal symptoms. No indication for ischemic evaluation.   Heart healthy diet and regular cardiovascular exercise encouraged.    Labwork independently reviewed:   ROS: .    Please see the history of present illness. Otherwise, review of systems is positive for ***.  All other systems are reviewed and otherwise negative.  Studies Reviewed: Marland Kitchen    EKG:  EKG is ordered today, personally reviewed, demonstrating ***  CV Studies: Cardiac studies reviewed are outlined and summarized above. Otherwise please see EMR for full report.   Current Reported Medications:.    No outpatient medications have been marked as taking for the 06/11/23 encounter (Appointment)  with Laurann Montana, PA-C.    Physical Exam:    VS:  There were no vitals taken for this visit.   Wt Readings from Last 3 Encounters:  09/18/21 176 lb (79.8 kg)  05/06/17 169 lb 8 oz (76.9 kg)  08/21/13 158 lb (71.7 kg)    GEN: Well nourished, well developed in no acute distress NECK: No JVD; No carotid bruits CARDIAC: ***RRR, no murmurs, rubs, gallops RESPIRATORY:  Clear to auscultation without rales, wheezing or rhonchi  ABDOMEN: Soft, non-tender, non-distended EXTREMITIES:  No edema; No acute deformity   Asessement and Plan:.     ***     Disposition: F/u with ***  Signed, Rip Harbour, NP

## 2023-06-11 ENCOUNTER — Encounter: Payer: Self-pay | Admitting: Physician Assistant

## 2023-06-11 ENCOUNTER — Ambulatory Visit: Payer: BC Managed Care – PPO | Attending: Internal Medicine | Admitting: Cardiology

## 2023-06-11 VITALS — BP 128/80 | HR 79 | Ht 66.0 in | Wt 174.0 lb

## 2023-06-11 DIAGNOSIS — Z720 Tobacco use: Secondary | ICD-10-CM | POA: Diagnosis not present

## 2023-06-11 DIAGNOSIS — E785 Hyperlipidemia, unspecified: Secondary | ICD-10-CM | POA: Diagnosis not present

## 2023-06-11 DIAGNOSIS — I491 Atrial premature depolarization: Secondary | ICD-10-CM

## 2023-06-11 DIAGNOSIS — I2510 Atherosclerotic heart disease of native coronary artery without angina pectoris: Secondary | ICD-10-CM

## 2023-06-11 NOTE — Patient Instructions (Signed)
Medication Instructions:  Your physician recommends that you continue on your current medications as directed. Please refer to the Current Medication list given to you today.  *If you need a refill on your cardiac medications before your next appointment, please call your pharmacy*   Lab Work: TODAY:  CMET & LIPID  If you have labs (blood work) drawn today and your tests are completely normal, you will receive your results only by: MyChart Message (if you have MyChart) OR A paper copy in the mail If you have any lab test that is abnormal or we need to change your treatment, we will call you to review the results.   Testing/Procedures: None ordered   You have been referred to LIPID CLINIC   Follow-Up: At [facility], you and your health needs are our priority.  As part of our continuing mission to provide you with exceptional heart care, we have created designated Provider Care Teams.  These Care Teams include your primary Cardiologist (physician) and Advanced Practice Providers (APPs -  Physician Assistants and Nurse Practitioners) who all work together to provide you with the care you need, when you need it.  We recommend signing up for the patient portal called "MyChart".  Sign up information is provided on this After Visit Summary.  MyChart is used to connect with patients for Virtual Visits (Telemedicine).  Patients are able to view lab/test results, encounter notes, upcoming appointments, etc.  Non-urgent messages can be sent to your provider as well.   To learn more about what you can do with MyChart, go to [email or web address].    Your next appointment:   1 year(s)  Provider:   Dietrich Pates, MD     Other Instructions

## 2023-06-12 LAB — COMPREHENSIVE METABOLIC PANEL
ALT: 16 [IU]/L (ref 0–44)
AST: 23 [IU]/L (ref 0–40)
Albumin: 4.6 g/dL (ref 3.9–4.9)
Alkaline Phosphatase: 56 [IU]/L (ref 44–121)
BUN/Creatinine Ratio: 16 (ref 10–24)
BUN: 17 mg/dL (ref 8–27)
Bilirubin Total: 0.3 mg/dL (ref 0.0–1.2)
CO2: 22 mmol/L (ref 20–29)
Calcium: 9.2 mg/dL (ref 8.6–10.2)
Chloride: 102 mmol/L (ref 96–106)
Creatinine, Ser: 1.05 mg/dL (ref 0.76–1.27)
Globulin, Total: 2.1 g/dL (ref 1.5–4.5)
Glucose: 98 mg/dL (ref 70–99)
Potassium: 5 mmol/L (ref 3.5–5.2)
Sodium: 138 mmol/L (ref 134–144)
Total Protein: 6.7 g/dL (ref 6.0–8.5)
eGFR: 77 mL/min/{1.73_m2} (ref >59)

## 2023-06-12 LAB — LIPID PANEL
Chol/HDL Ratio: 3.7 {ratio} (ref 0.0–5.0)
Cholesterol, Total: 213 mg/dL — ABNORMAL HIGH (ref 100–199)
HDL: 57 mg/dL (ref >39)
LDL Chol Calc (NIH): 136 mg/dL — ABNORMAL HIGH (ref 0–99)
Triglycerides: 110 mg/dL (ref 0–149)
VLDL Cholesterol Cal: 20 mg/dL (ref 5–40)

## 2023-06-27 DIAGNOSIS — H353221 Exudative age-related macular degeneration, left eye, with active choroidal neovascularization: Secondary | ICD-10-CM | POA: Diagnosis not present

## 2023-07-17 ENCOUNTER — Ambulatory Visit: Payer: BC Managed Care – PPO

## 2023-07-24 ENCOUNTER — Ambulatory Visit: Payer: BC Managed Care – PPO | Attending: Cardiology | Admitting: Student

## 2023-07-24 ENCOUNTER — Other Ambulatory Visit (HOSPITAL_COMMUNITY): Payer: Self-pay

## 2023-07-24 ENCOUNTER — Telehealth: Payer: Self-pay | Admitting: Pharmacy Technician

## 2023-07-24 DIAGNOSIS — E785 Hyperlipidemia, unspecified: Secondary | ICD-10-CM | POA: Diagnosis not present

## 2023-07-24 NOTE — Assessment & Plan Note (Addendum)
Assessment - LDL-C of 136 mg/dL uncontrolled above goal <70 mg/dL given CAD (elevated CAC) and + family history.  - Pt tolerating ezetimibe well, however LDL-C increased from 106 mg/dL to 161 mg/dL on treatment.  - Pt is intolerant of statins with multiple trials - severe muscle pains and rash.  - Pt would be an excellent candidate for PCSK9i (Repatha (evolocumab) to achieve 50-60% LDL-C lowering, with expected minimal side effects. If this is not approved on insurance, will pursue coverage of Leqvio (inclisiran), followed by Nexlizet (bempedoic acid/ezetimibe).   Plan - Will initiate PA for Repatha and communicate coverage to patient via telephone. If approved, START Repatha (evolocumab) 140 mg subcutaneous every 2 weeks.  - Educated on administration and potential AE with Repatha.  - Obtain repeat lipid panel 2-3 months after initiating treatment. - Encouraged continued healthy heart diet and daily exercise to optimize CV risk. Discussed decreasing alcohol intake to improve risk profile.  - Obtained signature for Leqvio BI in event of Repatha denial.

## 2023-07-24 NOTE — Telephone Encounter (Signed)
Pharmacy Patient Advocate Encounter   Received notification from Pt Calls Messages that prior authorization for repatha is required/requested.   Insurance verification completed.   The patient is insured through [facility] .   Per test claim: PA required; PA submitted to above mentioned insurance via CoverMyMeds Key/confirmation #/EOC [id number] Status is pending

## 2023-07-24 NOTE — Telephone Encounter (Signed)
Pharmacy Patient Advocate Encounter  Received notification from [facility] that Prior Authorization for repatha has been APPROVED from 07/24/23 to 07/23/24. Ran test claim, Copay is $24.99- one month. This test claim was processed through [facility]- copay amounts may vary at other pharmacies due to pharmacy/plan contracts, or as the patient moves through the different stages of their insurance plan.   PA #/Case ID/Reference #: [id number]

## 2023-07-24 NOTE — Progress Notes (Signed)
Patient ID: [name]                 DOB: August 12, 1951                    MRN: [id number]      HPI: [name] is a 72 y.o. male patient referred to lipid clinic by Reather Littler, NP. PMH is significant for CAD (elevated CAC score), HLD, COPD, BPH.   Pt had an elevated calcium score of 160 in 2014 - this was repeated in 2018 which was 138 (66th percentile). Moderate calcium in proximal and mid LAD and RCA. Intolerant to multiple statins. Last seen in lipid clinic in Jan 2023 - attempted to obtain Repatha, which was denied by insurance (initially, and on appeal). He attempted to retrial rosuvastatin 5 mg, but began to have severe aches and pains in his arms and legs after a few weeks. He also developed a rash under his neck and on his chest. Decision was made to retrial ezetimibe, though he reported it being ineffective in the past.   Last seen by NP Reather Littler on 06/11/23 - and was doing well. Keeping up with healthy diet and daily exercise. Reported that he has new insurance, and he would be interested in attempting to obtain coverage for a PCSK9i again (or Nexlizet). LDL-C was elevated to 136 mg/dL, Scr 4.13 (eGFR 77), LFTs WNL. Reviewed options for lowering LDL cholesterol including PCSK-9 inhibitors, bempedoic acid and inclisiran.  Discussed mechanisms of action, dosing, side effects and potential decreases in LDL cholesterol.  Also reviewed cost information and potential options for patient assistance. Pt recently enrolled in medicare part B plan - still has commercial insurance through his wife. Repatha was denied when he had a [facility] plan, have not attempted PA through [facility] yet. If Repatha is denied, will consider purusing coverage of Leqvio with medicare part B + commercial plan.   Current Medications: ezetimibe 10 mg PO daily Intolerances: atorvastatin, rosuvastatin (down to 5 mg), & pravastatin (muscle and joint pain, difficult to go up stairs) Risk Factors: aortic atherosclerosis,  elevated calcium score, smoking, family history  LDL-C goal: <70 mg/dL  ApoB goal: <24 mg/dL  Diet: eats raw vegetables, mixed beans, no more than 2 oz of chicken for lunch and dinner   Exercise: weight lifting + cardio 5-6x/week   Family History: Heart disease in mother (20s), heart attack in father (age 74)   Social History: Former smoker (quit 2018), endorses occasional alcohol (has increased since retiring - [employer])  Labs: Lipid Panel     Component Value Date/Time   CHOL 213 (H) 06/11/2023 1130   TRIG 110 06/11/2023 1130   HDL 57 06/11/2023 1130   CHOLHDL 3.7 06/11/2023 1130   LDLCALC 136 (H) 06/11/2023 1130   LABVLDL 20 06/11/2023 1130    Past Medical History:  Diagnosis Date   Aortic atherosclerosis (HCC) 08/21/2013   Seen on CT scan   Aortic calcification (HCC)    Back pain    BPH (benign prostatic hyperplasia)    BPH (benign prostatic hyperplasia)    COPD (chronic obstructive pulmonary disease) (HCC)    Coronary artery calcification 08/21/2013   LAD/RCA-calcium score 160 in 2014   Depression    Dyslipidemia    Elevated fasting glucose    Elevated fasting glucose    History of colon polyps    Hyperlipidemia    Low back pain    Macular degeneration, wet (HCC)  Macular degeneration, wet (HCC)    PAC (premature atrial contraction)    PAC (premature atrial contraction)    Personal history of colonic polyps    Tobacco abuse     Current Outpatient Medications on File Prior to Visit  Medication Sig Dispense Refill   aspirin EC 81 MG tablet Take 1 tablet (81 mg total) by mouth daily. Swallow whole. 90 tablet 3   ezetimibe (ZETIA) 10 MG tablet Take 1 tablet (10 mg total) by mouth daily. 90 tablet 3   finasteride (PROSCAR) 5 MG tablet Take 5 mg by mouth daily.     losartan (COZAAR) 50 MG tablet Take 50 mg by mouth daily.     No current facility-administered medications on file prior to visit.    Allergies  Allergen Reactions   Prilosec [Omeprazole]     Statins     CAUSES EXTREME  MUSCLE AND JOINT PAIN    Assessment/Plan:  1. Hyperlipidemia -  Dyslipidemia Assessment - LDL-C of 136 mg/dL uncontrolled above goal <70 mg/dL given CAD (elevated CAC) and + family history.  - Pt tolerating ezetimibe well, however LDL-C increased from 106 mg/dL to 010 mg/dL on treatment.  - Pt is intolerant of statins with multiple trials - severe muscle pains and rash.  - Pt would be an excellent candidate for PCSK9i (Repatha (evolocumab) to achieve 50-60% LDL-C lowering, with expected minimal side effects. If this is not approved on insurance, will pursue coverage of Leqvio (inclisiran), followed by Nexlizet (bempedoic acid/ezetimibe).   Plan - Will initiate PA for Repatha and communicate coverage to patient via telephone. If approved, START Repatha (evolocumab) 140 mg subcutaneous every 2 weeks.  - Educated on administration and potential AE with Repatha.  - Obtain repeat lipid panel 2-3 months after initiating treatment. - Encouraged continued healthy heart diet and daily exercise to optimize CV risk. Discussed decreasing alcohol intake to improve risk profile.  - Obtained signature for Leqvio BI in event of Repatha denial.   Thank you,  Nils Pyle, PharmD PGY1 Pharmacy Resident  Carmela Hurt, Pharm.D [facility] of [facility] [address], [place], [place] [address]  Phone: [phone]; Fax: [phone]

## 2023-07-24 NOTE — Patient Instructions (Signed)
We will submit a prior authorization to your insurance for Repatha. We will call you with an update. This medication is injected into the skin once every 2 weeks (14 days).   If unable to get Repatha approved, we will pursue coverage for Leqvio (the injection given at an infusion center at 1 month, 3 months, then every 6 months).   Third option would be bempedoic acid/ezetimibe (Nexlizet), which is one tablet once daily.   Come back to have your lipid panel checked 2-3 months after starting a new medication. No lab appointment required.

## 2023-07-25 MED ORDER — REPATHA SURECLICK 140 MG/ML ~~LOC~~ SOAJ: 140.0000 mg | SUBCUTANEOUS | 11 refills | Status: DC

## 2023-07-25 MED ORDER — REPATHA SURECLICK 140 MG/ML ~~LOC~~ SOAJ: 140.0000 mg | SUBCUTANEOUS | 2 refills | Status: DC

## 2023-07-25 NOTE — Telephone Encounter (Signed)
Called patient to make aware of insurance approval of Repatha. Provided manufacturer phone number to be enrolled in $5 copay card ([phone]). Was unable to enroll in copay card online as patient is over 65.   Sent Rx to preferred pharmacy. Reminded patient to come back for lipid panel in 2-3 months.  Nils Pyle, PharmD PGY1 Pharmacy Resident

## 2023-07-25 NOTE — Addendum Note (Signed)
Addended by: Particia Lather on: 07/25/2023 09:16 AM   Modules accepted: Orders

## 2023-08-01 DIAGNOSIS — E782 Mixed hyperlipidemia: Secondary | ICD-10-CM | POA: Diagnosis not present

## 2023-08-01 DIAGNOSIS — Z125 Encounter for screening for malignant neoplasm of prostate: Secondary | ICD-10-CM | POA: Diagnosis not present

## 2023-08-15 DIAGNOSIS — I10 Essential (primary) hypertension: Secondary | ICD-10-CM | POA: Diagnosis not present

## 2023-08-15 DIAGNOSIS — Z0000 Encounter for general adult medical examination without abnormal findings: Secondary | ICD-10-CM | POA: Diagnosis not present

## 2023-08-15 DIAGNOSIS — N400 Enlarged prostate without lower urinary tract symptoms: Secondary | ICD-10-CM | POA: Diagnosis not present

## 2023-08-15 DIAGNOSIS — E782 Mixed hyperlipidemia: Secondary | ICD-10-CM | POA: Diagnosis not present

## 2023-08-23 ENCOUNTER — Other Ambulatory Visit: Payer: Self-pay | Admitting: Acute Care

## 2023-08-23 ENCOUNTER — Encounter: Payer: Self-pay | Admitting: Acute Care

## 2023-08-23 DIAGNOSIS — Z87891 Personal history of nicotine dependence: Secondary | ICD-10-CM

## 2023-08-23 DIAGNOSIS — Z122 Encounter for screening for malignant neoplasm of respiratory organs: Secondary | ICD-10-CM

## 2023-09-24 ENCOUNTER — Other Ambulatory Visit: Payer: Self-pay | Admitting: Cardiology

## 2023-09-24 DIAGNOSIS — E785 Hyperlipidemia, unspecified: Secondary | ICD-10-CM

## 2023-10-01 ENCOUNTER — Ambulatory Visit
Admission: RE | Admit: 2023-10-01 | Discharge: 2023-10-01 | Disposition: A | Discharge status: Home / Self Care | Payer: BC Managed Care – PPO | Source: Ambulatory Visit | Attending: Acute Care | Admitting: Acute Care

## 2023-10-01 DIAGNOSIS — Z87891 Personal history of nicotine dependence: Secondary | ICD-10-CM | POA: Diagnosis not present

## 2023-10-01 DIAGNOSIS — Z122 Encounter for screening for malignant neoplasm of respiratory organs: Secondary | ICD-10-CM

## 2023-10-02 DIAGNOSIS — H35372 Puckering of macula, left eye: Secondary | ICD-10-CM | POA: Diagnosis not present

## 2023-10-02 DIAGNOSIS — H43813 Vitreous degeneration, bilateral: Secondary | ICD-10-CM | POA: Diagnosis not present

## 2023-10-02 DIAGNOSIS — H35423 Microcystoid degeneration of retina, bilateral: Secondary | ICD-10-CM | POA: Diagnosis not present

## 2023-10-02 DIAGNOSIS — H35033 Hypertensive retinopathy, bilateral: Secondary | ICD-10-CM | POA: Diagnosis not present

## 2023-10-02 DIAGNOSIS — H353211 Exudative age-related macular degeneration, right eye, with active choroidal neovascularization: Secondary | ICD-10-CM | POA: Diagnosis not present

## 2023-10-03 DIAGNOSIS — H353221 Exudative age-related macular degeneration, left eye, with active choroidal neovascularization: Secondary | ICD-10-CM | POA: Diagnosis not present

## 2023-10-09 ENCOUNTER — Other Ambulatory Visit: Payer: Self-pay

## 2023-10-09 DIAGNOSIS — Z87891 Personal history of nicotine dependence: Secondary | ICD-10-CM

## 2023-10-09 DIAGNOSIS — Z122 Encounter for screening for malignant neoplasm of respiratory organs: Secondary | ICD-10-CM

## 2023-11-25 ENCOUNTER — Other Ambulatory Visit: Payer: Self-pay

## 2023-11-25 MED ORDER — EZETIMIBE 10 MG PO TABS: 10.0000 mg | ORAL_TABLET | Freq: Every day | ORAL | 2 refills | Status: DC

## 2024-01-27 ENCOUNTER — Encounter: Payer: Self-pay | Admitting: Internal Medicine

## 2024-01-27 ENCOUNTER — Other Ambulatory Visit (HOSPITAL_COMMUNITY): Payer: Self-pay

## 2024-01-27 ENCOUNTER — Telehealth: Payer: Self-pay | Admitting: Pharmacy Technician

## 2024-01-27 NOTE — Telephone Encounter (Signed)
 Pharmacy Patient Advocate Encounter   Received notification from Fax that prior authorization for Repatha  is required/requested.   Insurance verification completed.   The patient is insured through [place] .   Per test claim: PA required; PA submitted to above mentioned insurance via CoverMyMeds Key/confirmation #/EOC [id number] Status is pending

## 2024-01-27 NOTE — Telephone Encounter (Signed)
 Pharmacy Patient Advocate Encounter  Received notification from [facility] that Prior Authorization for Repatha  has been APPROVED from 01/27/24 to 07/29/24. Ran test claim, Copay is $47.00- one month. This test claim was processed through [facility]- copay amounts may vary at other pharmacies due to pharmacy/plan contracts, or as the patient moves through the different stages of their insurance plan.   PA #/Case ID/Reference #: [id number]

## 2024-02-20 ENCOUNTER — Encounter: Payer: Self-pay | Admitting: Internal Medicine

## 2024-08-19 ENCOUNTER — Other Ambulatory Visit: Payer: Self-pay | Admitting: Cardiology

## 2024-08-20 ENCOUNTER — Telehealth: Payer: Self-pay | Admitting: Internal Medicine

## 2024-08-20 NOTE — Telephone Encounter (Signed)
 Called patient for recall but he has moved to [facility]. Recall deleted.

## 2024-08-21 ENCOUNTER — Other Ambulatory Visit: Payer: Self-pay | Admitting: Cardiology

## 2024-08-21 MED ORDER — EZETIMIBE 10 MG PO TABS: 10.0000 mg | ORAL_TABLET | Freq: Every day | ORAL | 0 refills | Status: AC

## 2024-08-24 ENCOUNTER — Other Ambulatory Visit (HOSPITAL_COMMUNITY): Payer: Self-pay

## 2024-08-24 ENCOUNTER — Telehealth: Payer: Self-pay | Admitting: Pharmacy Technician

## 2024-08-24 NOTE — Telephone Encounter (Signed)
 Pharmacy Patient Advocate Encounter   Received notification from Onbase that prior authorization for repatha  is required/requested.   Insurance verification completed.   The patient is insured through [facility].   Per test claim: PA required; PA submitted to above mentioned insurance via Latent Key/confirmation #/EOC [id number] Status is pending

## 2024-08-24 NOTE — Telephone Encounter (Signed)
 Pharmacy Patient Advocate Encounter  Received notification from [facility] that Prior Authorization for repatha  has been APPROVED from 08/24/24 to 09/09/25   PA #/Case ID/Reference #: [id number]

## 2024-08-25 ENCOUNTER — Other Ambulatory Visit: Payer: Self-pay | Admitting: Cardiology

## 2024-10-01 ENCOUNTER — Other Ambulatory Visit
